# Patient Record
Sex: Male | Born: 1985 | Race: White | Hispanic: No | Marital: Married | Smoking: Never smoker
Health system: Southern US, Community
[De-identification: ages and names within clinical notes are randomized; demographics above are authoritative.]

## PROBLEM LIST (undated history)

## (undated) DIAGNOSIS — F909 Attention-deficit hyperactivity disorder, unspecified type: Secondary | ICD-10-CM

## (undated) HISTORY — PX: FRACTURE SURGERY: SHX138

## (undated) HISTORY — DX: Attention-deficit hyperactivity disorder, unspecified type: F90.9

---
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
## 2007-06-27 HISTORY — PX: OTHER SURGICAL HISTORY: SHX169

## 2008-07-08 ENCOUNTER — Ambulatory Visit: Payer: Self-pay | Admitting: Family Medicine

## 2009-07-30 ENCOUNTER — Ambulatory Visit: Payer: Self-pay | Admitting: Internal Medicine

## 2009-08-03 ENCOUNTER — Ambulatory Visit: Payer: Self-pay | Admitting: Internal Medicine

## 2009-08-03 DIAGNOSIS — N508 Other specified disorders of male genital organs: Secondary | ICD-10-CM | POA: Insufficient documentation

## 2009-08-23 ENCOUNTER — Ambulatory Visit: Payer: Self-pay | Admitting: Internal Medicine

## 2009-09-01 ENCOUNTER — Encounter: Admission: RE | Admit: 2009-09-01 | Discharge: 2009-09-01 | Payer: Self-pay | Admitting: Internal Medicine

## 2009-09-06 ENCOUNTER — Telehealth (INDEPENDENT_AMBULATORY_CARE_PROVIDER_SITE_OTHER): Payer: Self-pay | Admitting: *Deleted

## 2009-11-11 ENCOUNTER — Encounter: Payer: Self-pay | Admitting: Internal Medicine

## 2010-06-26 HISTORY — PX: EYE SURGERY: SHX253

## 2010-06-26 HISTORY — PX: REFRACTIVE SURGERY: SHX103

## 2010-07-17 ENCOUNTER — Encounter: Payer: Self-pay | Admitting: Internal Medicine

## 2010-07-26 NOTE — Assessment & Plan Note (Signed)
Summary: CONGESTION/COLD/KDC   Vital Signs:  Patient profile:   25 year old male Height:      70 inches Weight:      222.2 pounds BMI:     32.00 Temp:     98.4 degrees F BP sitting:   120 / 80  Vitals Entered By: Shary Decamp (July 30, 2009 3:10 PM) CC: acute only Is Patient Diabetic? No Comments  - congestion x 1 week  - scatchy throat, hoarse  - cough x yesterday Shary Decamp  July 30, 2009 3:12 PM    History of Present Illness: as above   Current Medications (verified): 1)  None  Allergies (verified): No Known Drug Allergies  Past History:  Past Medical History: Reviewed history from 07/08/2008 and no changes required. none   Past Surgical History: Reviewed history from 07/08/2008 and no changes required. left arm-plates right wrist-cyst   Social History: Reviewed history from 07/08/2008 and no changes required. no tobacco  Review of Systems       denies fevers no nausea or vomiting he has a lot of mucus in the throat every morning and he needs to cough it  up. Has not been taking any medication for the symptoms does  not feel that he has a lot of sinus or chest congestion  Physical Exam  General:  alert and well-developed.   Head:  face is symmetric, nontender Ears:  R ear normal and L ear normal.   Nose:  mild congestion Mouth:  no redness or discharge Lungs:  normal respiratory effort, no intercostal retractions, no accessory muscle use, and normal breath sounds.   Heart:  normal rate, regular rhythm, and no murmur.     Impression & Recommendations:  Problem # 1:  URI (ICD-465.9)  Patient Instructions: 1)   Get plenty of rest, drink lots of clear liquids, and use Tylenol or Ibuprofen as needed 2)  Sudafed 30 mg ( behind the counter ) take one tablet every 4 hours as needed for congestion 3)  mucinex DM twice in the as needed for cough and congestion  4)  call if not better by next week

## 2010-07-26 NOTE — Progress Notes (Signed)
Summary: referral  Phone Note Call from Patient Call back at [phone]   Caller: Mom Summary of Call: pt mother left VM stating that pt is still experiencing pain and would like to be referred.............Marland KitchenFelecia Deloach CMA  September 06, 2009 1:05 PM   Follow-up for Phone Call        pt mother aware referral put in awaiting appt info...........Marland KitchenFelecia Deloach CMA  September 06, 2009 2:30 PM

## 2010-07-26 NOTE — Assessment & Plan Note (Signed)
Summary: LUMP BEHIND TESTICLE/RH.....   Vital Signs:  Patient profile:   25 year old male Height:      70 inches Weight:      223.6 pounds Temp:     98.3 degrees F Pulse rate:   80 / minute Resp:     13 per minute BP sitting:   112 / 64  Vitals Entered By: Dena Billet  History of Present Illness: He felt "pulling " sensation in scrotum 2 weeks; it is constant now as , affected by position. Associated with nausea. He noted a "lump" behind L testicle.He had similar symptom 6-7 mos ago for 5-6 days. No trigger or injury.Rx: none  Allergies: No Known Drug Allergies  Past History:  Past Surgical History: left arm-plates right wrist-? cyst   Review of Systems General:  Denies chills, fever, sweats, and weight loss. GU:  Denies discharge, dysuria, genital sores, and hematuria.  Physical Exam  General:  well-nourished,in no acute distress; alert,appropriate and cooperative throughout examination Genitalia:  Testes bilaterally descended without nodularity, tenderness or masses.  1X0.5 cm mass  adjacent to L testice . No penis lesions or urethral discharge. Skin:  Intact without suspicious lesions or rashes Inguinal Nodes:  No significant adenopathy   Impression & Recommendations:  Problem # 1:  SCROTAL MASS (ICD-608.89) Probable painless  epididymitis   Complete Medication List: 1)  Doxycycline Hyclate 100 Mg Caps (Doxycycline hyclate) .Marland Kitchen.. 1 two times a day  Patient Instructions: 1)  Sitz  baths two times a day . Ultrasound of scrotum if the peri testicular lesion fails to resolve with antibiotics . Prescriptions: DOXYCYCLINE HYCLATE 100 MG CAPS (DOXYCYCLINE HYCLATE) 1 two times a day  #20 x 0   Entered and Authorized by:   Marga Melnick MD   Signed by:   Marga Melnick MD on 08/03/2009   Method used:   Print then Give to Patient   RxID:   [id number]

## 2010-07-26 NOTE — Assessment & Plan Note (Signed)
Summary: place is still there behind testicle/kdc   Vital Signs:  Patient profile:   25 year old male Weight:      226 pounds Pulse rate:   84 / minute Resp:     14 per minute BP sitting:   110 / 76  (left arm)  Vitals Entered By: Doristine Devoid (August 23, 2009 1:37 PM) CC: lump on testicle somewhat tender to touch   CC:  lump on testicle somewhat tender to touch.  History of Present Illness: Random throbbing  pain in scrotum lasting up to 20 minutes; last episode was 08/21/2009 while in bed.He has noted decreased "pulling " sensation after 10 days of Doxycycline. No FH of GU disease.  Allergies: No Known Drug Allergies  Review of Systems General:  Denies chills, fever, sweats, and weight loss. GU:  Denies discharge, dysuria, hematuria, nocturia, urinary frequency, and urinary hesitancy.  Physical Exam  General:  well-nourished,in no acute distress; alert,appropriate and cooperative throughout examination Genitalia:  Testes bilaterally descended without nodularity, tenderness or masses. No scrotal masses or lesions but prominent epididymis on L. No penis lesions or urethral discharge. L varicocele.   Skin:  Intact without suspicious lesions or rashes Inguinal Nodes:  No significant adenopathy Psych:  memory intact for recent and remote, normally interactive, and good eye contact.     Impression & Recommendations:  Problem # 1:  SCROTAL MASS (ICD-608.89)  ? low grade Epididymis on L  Orders: Radiology Referral (Radiology)  Complete Medication List: 1)  Ciprofloxacin Hcl 500 Mg Tabs (Ciprofloxacin hcl) .Marland Kitchen.. 1 two times a day  Patient Instructions: 1)  Complete US of scrotum. Prescriptions: CIPROFLOXACIN HCL 500 MG TABS (CIPROFLOXACIN HCL) 1 two times a day  #14 x 0   Entered and Authorized by:   Marga Melnick MD   Signed by:   Marga Melnick MD on 08/23/2009   Method used:   Print then Give to Patient   RxID:   [id number]

## 2010-07-26 NOTE — Consult Note (Signed)
Summary: [facility]  [facility]   Imported By: Lanelle Bal 11/29/2009 13:53:57  _____________________________________________________________________  External Attachment:    Type:   Image     Comment:   External Document

## 2011-08-23 ENCOUNTER — Encounter: Payer: Self-pay | Admitting: Family Medicine

## 2011-08-23 ENCOUNTER — Ambulatory Visit (INDEPENDENT_AMBULATORY_CARE_PROVIDER_SITE_OTHER): Payer: BC Managed Care – PPO | Admitting: Family Medicine

## 2011-08-23 VITALS — BP 125/80 | HR 97 | Temp 98.6°F | Ht 69.5 in | Wt 216.0 lb

## 2011-08-23 DIAGNOSIS — N451 Epididymitis: Secondary | ICD-10-CM

## 2011-08-23 DIAGNOSIS — N453 Epididymo-orchitis: Secondary | ICD-10-CM

## 2011-08-23 LAB — POCT URINALYSIS DIPSTICK
Blood, UA: NEGATIVE
Glucose, UA: NEGATIVE
Ketones, UA: NEGATIVE
Leukocytes, UA: NEGATIVE
Spec Grav, UA: 1.01
pH, UA: 8

## 2011-08-23 MED ORDER — DOXYCYCLINE HYCLATE 100 MG PO TABS: 100.0000 mg | ORAL_TABLET | Freq: Two times a day (BID) | ORAL | Status: DC

## 2011-08-23 MED ORDER — NAPROXEN 500 MG PO TABS: 500.0000 mg | ORAL_TABLET | Freq: Two times a day (BID) | ORAL | Status: DC

## 2011-08-23 MED ORDER — DOXYCYCLINE HYCLATE 100 MG PO TABS: 100.0000 mg | ORAL_TABLET | Freq: Two times a day (BID) | ORAL | Status: AC

## 2011-08-23 NOTE — Progress Notes (Signed)
  Subjective:    Patient ID: Phillip Harris, male    DOB: [birth year], 26 y.o.   MRN: [id number]  HPI Scrotal pain- had similar sxs 1-2 yrs ago.  Had L scrotal US w/ uro and was started on abx and NSAID.  sxs started 10 days ago.  sxs described as a 'swelling and tugging'.  No fevers.  No concerns about STDs.  No penile dc.  No dysuria.     Review of Systems For ROS see HPI     Objective:   Physical Exam  Vitals reviewed. Constitutional: He appears well-developed and well-nourished. No distress.  Genitourinary: Penis normal. Right testis shows no mass, no swelling and no tenderness. Left testis shows swelling and tenderness (over epididymis posteriorly). Left testis shows no mass. Left testis is descended. Circumcised. No discharge found.          Assessment & Plan:

## 2011-08-23 NOTE — Patient Instructions (Signed)
This is most likely the same thing you had before (epididymitis) Start the Doxy twice daily (w/ food) Use the Naproxen twice daily for pain and inflammation If symptoms change or worsen- please call! Hang in there!!

## 2011-08-24 LAB — GC/CHLAMYDIA PROBE AMP, URINE: GC Probe Amp, Urine: NEGATIVE

## 2011-08-27 NOTE — Assessment & Plan Note (Signed)
New.  Pt w/ hx of similar.  Per protocol, will get urine cx and GC/CT test.  Start doxy.  NSAIDs prn for pain.  Reviewed supportive care and red flags that should prompt return.  Pt expressed understanding and is in agreement w/ plan.

## 2011-08-30 ENCOUNTER — Telehealth: Payer: Self-pay

## 2011-08-30 DIAGNOSIS — N451 Epididymitis: Secondary | ICD-10-CM

## 2011-08-30 NOTE — Telephone Encounter (Signed)
Left message on voicemail with Dr.Tabori's recommendation's, patient to call and speak with Triage if questions or concerns

## 2011-08-30 NOTE — Telephone Encounter (Signed)
[facility] Triage Call Report Triage Record Num: [id number] Operator: Jeraldine Loots Patient Name: Phillip Harris Call Date & Time: 08/29/2011 4:55:37PM Patient Phone: [phone] PCP: Patient Gender: Male PCP Fax : Patient DOB: 1985-11-11 Practice Name: [facility] Reason for Call: Caller: Chris/Patient is calling with a question about the medication he was placed on for epididymitis. Has been on the medicaiton x 2 weeks and has not seen any improvement. He does not know the names of either medication. Denies any fever. Only has pain/swelling in his scrotum. Uncomfortable when he sits down. He is in [place], please call meds to [facility] at [phone]. Protocol(s) Used: Scrotum or Testicles Symptoms Recommended Outcome per Protocol: See Provider within 72 Hours Reason for Outcome: Lump or thickened, hardened mass Care Advice: ~ Dr.Tabori Please advise

## 2011-08-30 NOTE — Telephone Encounter (Signed)
If he is not improving on Cipro or having worsening sxs he will need urology appt.  Not sure what to do about him being in [place]- he may need UC there

## 2011-08-30 NOTE — Telephone Encounter (Signed)
.  left message to have patient return my call.  

## 2011-08-31 NOTE — Telephone Encounter (Signed)
Called pt to clarify how he is doing per noted left vm to call office, spoke to pt and he advised that his sxs are not feeling better, advised that we can set him up with a urologist after 1pm per he is out of school, sent referral in chart and left note for pt referral coordinator per pt preference, advised that pt should go to UC if his sxs worsen, pt advised that there are no UC in [place],advised that he can visit an ER in that area, pt understood and is waiting for call about his urology apt

## 2011-08-31 NOTE — Telephone Encounter (Signed)
Addended by: Derry Lory A on: 08/31/2011 03:56 PM   Modules accepted: Orders

## 2011-10-31 ENCOUNTER — Ambulatory Visit (INDEPENDENT_AMBULATORY_CARE_PROVIDER_SITE_OTHER): Payer: BC Managed Care – PPO | Admitting: Urology

## 2011-10-31 DIAGNOSIS — R1032 Left lower quadrant pain: Secondary | ICD-10-CM

## 2011-11-21 ENCOUNTER — Encounter (INDEPENDENT_AMBULATORY_CARE_PROVIDER_SITE_OTHER): Payer: Self-pay | Admitting: Surgery

## 2011-11-21 ENCOUNTER — Ambulatory Visit (INDEPENDENT_AMBULATORY_CARE_PROVIDER_SITE_OTHER): Payer: BC Managed Care – PPO | Admitting: Surgery

## 2011-11-21 VITALS — BP 108/80 | HR 76 | Temp 97.4°F | Resp 14 | Ht 69.0 in | Wt 206.8 lb

## 2011-11-21 DIAGNOSIS — R109 Unspecified abdominal pain: Secondary | ICD-10-CM

## 2011-11-21 DIAGNOSIS — R1030 Lower abdominal pain, unspecified: Secondary | ICD-10-CM

## 2011-11-21 NOTE — Patient Instructions (Signed)
Hernia Repair with Laparoscope A hernia occurs when an internal organ pushes out through a weak spot in the belly (abdominal) wall muscles. Hernias most commonly occur in the groin and around the navel. Hernias can also occur through a cut by the surgeon (incision) after an abdominal operation. A hernia may be caused by:  Lifting heavy objects.   Prolonged coughing.   Straining to move your bowels.  Hernias can often be pushed back into place (reduced). Most hernias tend to get worse over time. Problems occur when abdominal contents get stuck in the opening and the blood supply is blocked or impaired (incarcerated hernia). Because of these risks, you require surgery to repair the hernia. Your hernia will be repaired using a laparoscope. Laparoscopic surgery is a type of minimally invasive surgery. It does not involve making a typical surgical cut (incision) in the skin. A laparoscope is a telescope-like rod and lens system. It is usually connected to a video camera and a light source so your caregiver can clearly see the operative area. The instruments are inserted through  to  inch (5 mm or 10 mm) openings in the skin at specific locations. A working and viewing space is created by blowing a small amount of carbon dioxide gas into the abdominal cavity. The abdomen is essentially blown up like a balloon (insufflated). This elevates the abdominal wall above the internal organs like a dome. The carbon dioxide gas is common to the human body and can be absorbed by tissue and removed by the respiratory system. Once the repair is completed, the small incisions will be closed with either stitches (sutures) or staples (just like a paper stapler only this staple holds the skin together). LET YOUR CAREGIVERS KNOW ABOUT:  Allergies.   Medications taken including herbs, eye drops, over the counter medications, and creams.   Use of steroids (by mouth or creams).   Previous problems with anesthetics or  Novocaine.   Possibility of pregnancy, if this applies.   History of blood clots (thrombophlebitis).   History of bleeding or blood problems.   Previous surgery.   Other health problems.  BEFORE THE PROCEDURE  Laparoscopy can be done either in a hospital or out-patient clinic. You may be given a mild sedative to help you relax before the procedure. Once in the operating room, you will be given a general anesthesia to make you sleep (unless you and your caregiver choose a different anesthetic).  AFTER THE PROCEDURE  After the procedure you will be watched in a recovery area. Depending on what type of hernia was repaired, you might be admitted to the hospital or you might go home the same day. With this procedure you may have less pain and scarring. This usually results in a quicker recovery and less risk of infection. HOME CARE INSTRUCTIONS   Bed rest is not required. You may continue your normal activities but avoid heavy lifting (more than 10 pounds) or straining.   Cough gently. If you are a smoker it is best to stop, as even the best hernia repair can break down with the continual strain of coughing.   Avoid driving until given the OK by your surgeon.   There are no dietary restrictions unless given otherwise.   TAKE ALL MEDICATIONS AS DIRECTED.   Only take over-the-counter or prescription medicines for pain, discomfort, or fever as directed by your caregiver.  SEEK MEDICAL CARE IF:   There is increasing abdominal pain or pain in your incisions.     There is more bleeding from incisions, other than minimal spotting.   You feel light headed or faint.   You develop an unexplained fever, chills, and/or an oral temperature above 102 F (38.9 C).   You have redness, swelling, or increasing pain in the wound.   Pus coming from wound.   A foul smell coming from the wound or dressings.  SEEK IMMEDIATE MEDICAL CARE IF:   You develop a rash.   You have difficulty breathing.    You have any allergic problems.  MAKE SURE YOU:   Understand these instructions.   Will watch your condition.   Will get help right away if you are not doing well or get worse.  Document Released: 06/12/2005 Document Revised: 06/01/2011 Document Reviewed: 05/12/2009 Exit[employer] Patient Information 2012 [employer]. 

## 2011-11-21 NOTE — Progress Notes (Signed)
Patient ID: Phillip Harris, male   DOB: Apr 17, 1986, 26 y.o.   MRN: [id number]  Chief Complaint  Patient presents with  . New Evaluation    New Pt. Abd Pain/Hernia    HPI Phillip Harris is a 26 y.o. male.   HPIPatient sent at the request of Dr. Juan Quam due to left groin pain. He has a history of left testicular discomfort and tugging sensation since 2011. He has been treated for epididymitis. This helped initially left testicular discomfort and tugging sensation had returned. He has had a full urologic workup and this has been within normal limits.  History reviewed. No pertinent past medical history.  Past Surgical History  Procedure Date  . Refractive surgery 2012  . Left forearm surgery 2004  . Right wrist surgery 2009    Family History  Problem Relation Age of Onset  . Diabetes Mother     Social History History  Substance Use Topics  . Smoking status: Never Smoker   . Smokeless tobacco: Not on file  . Alcohol Use: Yes     occasionally    No Known Allergies  No current outpatient prescriptions on file.    Review of Systems Review of Systems  Constitutional: Negative for fever, chills and unexpected weight change.  HENT: Negative for hearing loss, congestion, sore throat, trouble swallowing and voice change.   Eyes: Negative for visual disturbance.  Respiratory: Negative for cough and wheezing.   Cardiovascular: Negative for chest pain, palpitations and leg swelling.  Gastrointestinal: Negative for nausea, vomiting, abdominal pain, diarrhea, constipation, blood in stool, abdominal distention, anal bleeding and rectal pain.  Genitourinary: Negative for hematuria and difficulty urinating.  Musculoskeletal: Negative for arthralgias.  Skin: Negative for rash and wound.  Neurological: Negative for seizures, syncope, weakness and headaches.  Hematological: Negative for adenopathy. Does not bruise/bleed easily.  Psychiatric/Behavioral: Negative for  confusion.    Blood pressure 108/80, pulse 76, temperature 97.4 F (36.3 C), temperature source Temporal, resp. rate 14, height 5\' 9"  (1.753 m), weight 206 lb 12.8 oz (93.804 kg).  Physical Exam Physical Exam  Constitutional: He appears well-developed and well-nourished.  HENT:  Head: Normocephalic and atraumatic.  Eyes: EOM are normal. Pupils are equal, round, and reactive to light.  Neck: Normal range of motion.  Cardiovascular: Normal rate and regular rhythm.   Pulmonary/Chest: Effort normal and breath sounds normal.  Abdominal: Soft. Bowel sounds are normal. He exhibits no distension.  Genitourinary:       Data Reviewed Notes from Dr Juan Quam  Assessment    Left groin pain    Plan    Discussed options with the patient. He is no obvious hernia on exam. Both testicles and spermatic cord feel normal. I discussed laparoscopic exploration and possible left inguinal hernia repair with mesh. I discussed observation as well. There is no testing will be helpful in this situation. Risks, benefits and alternative therapies all discussed. Like to proceed with laparoscopic left inguinal hernia repair and exploration.  The risk of hernia repair include bleeding,  Infection,   Recurrence of the hernia,  Mesh use, chronic pain,  Organ injury,  Bowel injury,  Bladder injury,   nerve injury with numbness around the incision,  Death,  and worsening of preexisting  medical problems.  The alternatives to surgery have been discussed as well..  Long term expectations of both operative and non operative treatments have been discussed.   The patient agrees to proceed.       Callyn Severtson A. 11/21/2011,  2:59 PM

## 2011-12-27 ENCOUNTER — Ambulatory Visit: Admit: 2011-12-27 | Payer: Self-pay | Admitting: Surgery

## 2011-12-27 SURGERY — REPAIR, HERNIA, INGUINAL, LAPAROSCOPIC
Anesthesia: General | Laterality: Left

## 2012-01-23 ENCOUNTER — Encounter (INDEPENDENT_AMBULATORY_CARE_PROVIDER_SITE_OTHER): Payer: BC Managed Care – PPO | Admitting: Surgery

## 2013-05-11 ENCOUNTER — Encounter (HOSPITAL_BASED_OUTPATIENT_CLINIC_OR_DEPARTMENT_OTHER): Payer: Self-pay | Admitting: Emergency Medicine

## 2013-05-11 ENCOUNTER — Emergency Department (HOSPITAL_BASED_OUTPATIENT_CLINIC_OR_DEPARTMENT_OTHER)
Admission: EM | Admit: 2013-05-11 | Discharge: 2013-05-11 | Disposition: A | Discharge status: Home / Self Care | Payer: Worker's Compensation | Attending: Emergency Medicine | Admitting: Emergency Medicine

## 2013-05-11 ENCOUNTER — Emergency Department (HOSPITAL_BASED_OUTPATIENT_CLINIC_OR_DEPARTMENT_OTHER): Payer: Worker's Compensation

## 2013-05-11 DIAGNOSIS — Y9389 Activity, other specified: Secondary | ICD-10-CM | POA: Insufficient documentation

## 2013-05-11 DIAGNOSIS — S9781XA Crushing injury of right foot, initial encounter: Secondary | ICD-10-CM

## 2013-05-11 DIAGNOSIS — S9780XA Crushing injury of unspecified foot, initial encounter: Secondary | ICD-10-CM | POA: Insufficient documentation

## 2013-05-11 DIAGNOSIS — Y92410 Unspecified street and highway as the place of occurrence of the external cause: Secondary | ICD-10-CM | POA: Insufficient documentation

## 2013-05-11 DIAGNOSIS — Y990 Civilian activity done for income or pay: Secondary | ICD-10-CM | POA: Insufficient documentation

## 2013-05-11 DIAGNOSIS — IMO0002 Reserved for concepts with insufficient information to code with codable children: Secondary | ICD-10-CM | POA: Insufficient documentation

## 2013-05-11 NOTE — ED Notes (Signed)
Transported to xray 
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
## 2013-05-11 NOTE — ED Notes (Signed)
Returned from xray
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
## 2014-04-27 ENCOUNTER — Encounter: Payer: Self-pay | Admitting: Internal Medicine

## 2014-04-27 ENCOUNTER — Ambulatory Visit (INDEPENDENT_AMBULATORY_CARE_PROVIDER_SITE_OTHER): Payer: 59 | Admitting: Internal Medicine

## 2014-04-27 VITALS — BP 145/71 | HR 69 | Temp 98.3°F | Wt 216.2 lb

## 2014-04-27 DIAGNOSIS — K625 Hemorrhage of anus and rectum: Secondary | ICD-10-CM

## 2014-04-27 DIAGNOSIS — K648 Other hemorrhoids: Secondary | ICD-10-CM

## 2014-04-27 HISTORY — DX: Other hemorrhoids: K64.8

## 2014-04-27 LAB — CBC WITH DIFFERENTIAL/PLATELET
BASOS PCT: 0.3 % (ref 0.0–3.0)
Basophils Absolute: 0 10*3/uL (ref 0.0–0.1)
EOS ABS: 0.3 10*3/uL (ref 0.0–0.7)
EOS PCT: 4.8 % (ref 0.0–5.0)
HCT: 42.2 % (ref 39.0–52.0)
Hemoglobin: 13.8 g/dL (ref 13.0–17.0)
LYMPHS ABS: 1.9 10*3/uL (ref 0.7–4.0)
LYMPHS PCT: 31.4 % (ref 12.0–46.0)
MCHC: 32.5 g/dL (ref 30.0–36.0)
MCV: 86.8 fl (ref 78.0–100.0)
MONO ABS: 0.5 10*3/uL (ref 0.1–1.0)
MONOS PCT: 7.7 % (ref 3.0–12.0)
Neutro Abs: 3.4 10*3/uL (ref 1.4–7.7)
Neutrophils Relative %: 55.8 % (ref 43.0–77.0)
Platelets: 187 10*3/uL (ref 150.0–400.0)
RBC: 4.87 Mil/uL (ref 4.22–5.81)
RDW: 12.7 % (ref 11.5–15.5)
WBC: 6.1 10*3/uL (ref 4.0–10.5)

## 2014-04-27 MED ORDER — HYDROCORTISONE ACETATE 25 MG RE SUPP: 25.0000 mg | Freq: Two times a day (BID) | RECTAL | Status: DC | PRN

## 2014-04-27 NOTE — Patient Instructions (Signed)
Keep the area clean and dry Metamucil one or 2 capsules every day with   at the time of your breakfast Use the suppositories if you have any itching, bleeding or discomfort Call anytime if symptoms severe     Hemorrhoids Hemorrhoids are swollen veins around the rectum or anus. There are two types of hemorrhoids:   Internal hemorrhoids. These occur in the veins just inside the rectum. They may poke through to the outside and become irritated and painful.  External hemorrhoids. These occur in the veins outside the anus and can be felt as a painful swelling or hard lump near the anus. CAUSES  Pregnancy.   Obesity.   Constipation or diarrhea.   Straining to have a bowel movement.   Sitting for long periods on the toilet.  Heavy lifting or other activity that caused you to strain.  Anal intercourse. SYMPTOMS   Pain.   Anal itching or irritation.   Rectal bleeding.   Fecal leakage.   Anal swelling.   One or more lumps around the anus.  DIAGNOSIS  Your caregiver may be able to diagnose hemorrhoids by visual examination. Other examinations or tests that may be performed include:   Examination of the rectal area with a gloved hand (digital rectal exam).   Examination of anal canal using a small tube (scope).   A blood test if you have lost a significant amount of blood.  A test to look inside the colon (sigmoidoscopy or colonoscopy). TREATMENT Most hemorrhoids can be treated at home. However, if symptoms do not seem to be getting better or if you have a lot of rectal bleeding, your caregiver may perform a procedure to help make the hemorrhoids get smaller or remove them completely. Possible treatments include:   Placing a rubber band at the base of the hemorrhoid to cut off the circulation (rubber band ligation).   Injecting a chemical to shrink the hemorrhoid (sclerotherapy).   Using a tool to burn the hemorrhoid (infrared light therapy).    Surgically removing the hemorrhoid (hemorrhoidectomy).   Stapling the hemorrhoid to block blood flow to the tissue (hemorrhoid stapling).  HOME CARE INSTRUCTIONS   Eat foods with fiber, such as whole grains, beans, nuts, fruits, and vegetables. Ask your doctor about taking products with added fiber in them (fibersupplements).  Increase fluid intake. Drink enough water and fluids to keep your urine clear or pale yellow.   Exercise regularly.   Go to the bathroom when you have the urge to have a bowel movement. Do not wait.   Avoid straining to have bowel movements.   Keep the anal area dry and clean. Use wet toilet paper or moist towelettes after a bowel movement.   Medicated creams and suppositories may be used or applied as directed.   Only take over-the-counter or prescription medicines as directed by your caregiver.   Take warm sitz baths for 15-20 minutes, 3-4 times a day to ease pain and discomfort.   Place ice packs on the hemorrhoids if they are tender and swollen. Using ice packs between sitz baths may be helpful.   Put ice in a plastic bag.   Place a towel between your skin and the bag.   Leave the ice on for 15-20 minutes, 3-4 times a day.   Do not use a donut-shaped pillow or sit on the toilet for long periods. This increases blood pooling and pain.  SEEK MEDICAL CARE IF:  You have increasing pain and swelling that is not  controlled by treatment or medicine.  You have uncontrolled bleeding.  You have difficulty or you are unable to have a bowel movement.  You have pain or inflammation outside the area of the hemorrhoids. MAKE SURE YOU:  Understand these instructions.  Will watch your condition.  Will get help right away if you are not doing well or get worse. Document Released: 06/09/2000 Document Revised: 05/29/2012 Document Reviewed: 04/16/2012 [facility] Patient Information 2015 [employer] [place]. This information is not intended to  replace advice given to you by your health care provider. Make sure you discuss any questions you have with your health care provider.

## 2014-04-27 NOTE — Progress Notes (Signed)
Pre visit review using our clinic review tool, if applicable. No additional management support is needed unless otherwise documented below in the visit note. 

## 2014-04-27 NOTE — Progress Notes (Signed)
   Subjective:    Patient ID: Phillip Harris, male    DOB: January 23, 1986, 28 y.o.   MRN: [id number]  DOS:  04/27/2014 Type of visit - description : acute Interval history: one-month history of blood per rectum. For the last 4 weeks every time he had a bowel movement he saw red blood  On top  the stools, never mixed w/ it . Sometimes he saw red fresh blood after he had a BM (in the toilete paper) No symptoms for the last 2 days. No previous history of hemorrhoids. Very seldom has itching/discomfort at the rectal anal area  ROS Denies fever, chills. No weight loss. No nausea, vomiting, diarrhea or abdominal pain. Occasional constipation  No past medical history on file.  Past Surgical History  Procedure Laterality Date  . Refractive surgery  2012  . Left forearm surgery  2004  . Right wrist surgery  2009    History   Social History  . Marital Status: Single    Spouse Name: N/A    Number of Children: N/A  . Years of Education: N/A   Occupational History  . Not on file.   Social History Main Topics  . Smoking status: Never Smoker   . Smokeless tobacco: Not on file  . Alcohol Use: Yes     Comment: occasionally  . Drug Use: No  . Sexual Activity: Not on file   Other Topics Concern  . Not on file   Social History Narrative        Medication List    Notice  As of 04/27/2014 10:14 AM   You have not been prescribed any medications.         Objective:   Physical Exam BP 145/71 mmHg  Pulse 69  Temp(Src) 98.3 F (36.8 C) (Oral)  Wt 216 lb 4 oz (98.09 kg)  SpO2 98%  General -- alert, well-developed, NAD.    HEENT-- Not pale.   Lungs -- normal respiratory effort, no intercostal retractions, no accessory muscle use, and normal breath sounds.  Heart-- normal rate, regular rhythm, no murmur.  Abdomen-- Not distended, good bowel sounds,soft, non-tender.  Rectal-- No external abnormalities noted. Normal sphincter tone. No rectal masses or tenderness. Stool  brown Anoscopy: few small internal hemorrhoids Prostate--Prostate gland firm and smooth, no enlargement, nodularity, tenderness, mass, asymmetry or induration. Extremities-- no pretibial edema bilaterally   Psych-- Cognition and judgment appear intact. Cooperative with normal attention span and concentration. No anxious or depressed appearing.       Assessment & Plan:

## 2014-04-27 NOTE — Assessment & Plan Note (Signed)
Red blood per rectum, One-month history of red blood per rectum, he does have internal hemorrhoids. Plan: CBC, hemorrhoid care  Discussed refer to GI if anemia or sx persist

## 2014-06-17 ENCOUNTER — Encounter: Payer: Self-pay | Admitting: Physician Assistant

## 2014-06-17 ENCOUNTER — Telehealth: Payer: Self-pay | Admitting: Internal Medicine

## 2014-06-17 ENCOUNTER — Ambulatory Visit (INDEPENDENT_AMBULATORY_CARE_PROVIDER_SITE_OTHER): Payer: 59 | Admitting: Physician Assistant

## 2014-06-17 VITALS — BP 126/60 | HR 71 | Temp 98.6°F | Resp 16 | Ht 70.0 in | Wt 222.0 lb

## 2014-06-17 DIAGNOSIS — Z8659 Personal history of other mental and behavioral disorders: Secondary | ICD-10-CM

## 2014-06-17 DIAGNOSIS — F909 Attention-deficit hyperactivity disorder, unspecified type: Secondary | ICD-10-CM | POA: Insufficient documentation

## 2014-06-17 NOTE — Telephone Encounter (Signed)
Caller name: Farrell OursMartin, Jarmel E Relation to pt: self  Call back number: [phone]   Reason for call:  Pt states the psychiatrist your referred Dr. Westley ChandlerKarr the front desk clerk told him they don't prescribed Adderal. Pt in need of clinical advice

## 2014-06-17 NOTE — Progress Notes (Signed)
Patient presents to clinic today to discuss restarting ADHD medications.  Patient endorses being diagnosed at age 28.  Endorses being placed on Adderall and Ritalin from Elementary school through about the 7th grade.  Did not require medication throughout high school.  Patient states he weaned himself off of the medication.  State he was able to make it through high school without medication.  Currently works as a Emergency planning/management officerpolice officer and endorses good focus on the job.  Is starting school again to finish his bachelor's and is concerned that the workload combined with working full-time will cause his symptoms to resurface.  No past medical history on file.  No current outpatient prescriptions on file prior to visit.   No current facility-administered medications on file prior to visit.    No Known Allergies  Family History  Problem Relation Age of Onset  . Diabetes Mother     History   Social History  . Marital Status: Single    Spouse Name: N/A    Number of Children: 0  . Years of Education: N/A   Social History Main Topics  . Smoking status: Never Smoker   . Smokeless tobacco: Never Used  . Alcohol Use: 0.0 oz/week    0 Not specified per week     Comment: occasionally  . Drug Use: No  . Sexual Activity: None   Other Topics Concern  . None   Social History Narrative    Review of Systems - See HPI.  All other ROS are negative.  BP 126/60 mmHg  Pulse 71  Temp(Src) 98.6 F (37 C) (Oral)  Resp 16  Ht 5\' 10"  (1.778 m)  Wt 222 lb (100.699 kg)  BMI 31.85 kg/m2  SpO2 99%  Physical Exam  Constitutional: He is oriented to person, place, and time and well-developed, well-nourished, and in no distress.  HENT:  Head: Normocephalic and atraumatic.  Neurological: He is alert and oriented to person, place, and time.  Skin: Skin is warm and dry. No rash noted.  Psychiatric: Affect normal.    Recent Results (from the past 2160 hour(s))  CBC with Differential     Status: None     Collection Time: 04/27/14 10:37 AM  Result Value Ref Range   WBC 6.1 4.0 - 10.5 K/uL   RBC 4.87 4.22 - 5.81 Mil/uL   Hemoglobin 13.8 13.0 - 17.0 g/dL   HCT 16.142.2 09.639.0 - 04.552.0 %   MCV 86.8 78.0 - 100.0 fl   MCHC 32.5 30.0 - 36.0 g/dL   RDW 40.912.7 81.111.5 - 91.415.5 %   Platelets 187.0 150.0 - 400.0 K/uL   Neutrophils Relative % 55.8 43.0 - 77.0 %   Lymphocytes Relative 31.4 12.0 - 46.0 %   Monocytes Relative 7.7 3.0 - 12.0 %   Eosinophils Relative 4.8 0.0 - 5.0 %   Basophils Relative 0.3 0.0 - 3.0 %   Neutro Abs 3.4 1.4 - 7.7 K/uL   Lymphs Abs 1.9 0.7 - 4.0 K/uL   Monocytes Absolute 0.5 0.1 - 1.0 K/uL   Eosinophils Absolute 0.3 0.0 - 0.7 K/uL   Basophils Absolute 0.0 0.0 - 0.1 K/uL    Assessment/Plan: Hx of attention deficit hyperactivity disorder Not requiring medication for most of adolesence and all of adulthood thus far.  Patient will need formal evaluation by Psychology to assess for ADD and need for medication.  I am not patient's PCP, so I will not be starting medication.  This will be deferred to Dr. Drue NovelPaz.  Referral placed to Psychology for assessment. Handout given.  PAtient will schedule own appointment.

## 2014-06-17 NOTE — Progress Notes (Signed)
Pre visit review using our clinic review tool, if applicable. No additional management support is needed unless otherwise documented below in the visit note/SLS  

## 2014-06-17 NOTE — Telephone Encounter (Signed)
Spoke with patient about this matter to inform him that Referral is only for ADHD testing, medication will be prescribed by his PCP, Dr. Drue NovelPaz, afterwards; pt understands & states that he was informed that office he was instructed to call informed him that they did not do testing either. Advised patient that I would speak with Dr. Drue NovelPaz, as Selena BattenCody is out of the office until next week & also our [facility], who handles the referrals and contact him when I had furether information on this matter/SLS Spoke with Victorino DikeJennifer, [facility], and was informed that the ADHD testing is done through Psychiatry [not Psychology, as referred] and that a new referral was needed and that [facility]; new referral placed. Called patient and explained the situation and that he should be contacted about appointment soon; pt understood/SLS

## 2014-06-17 NOTE — Assessment & Plan Note (Signed)
Not requiring medication for most of adolesence and all of adulthood thus far.  Patient will need formal evaluation by Psychology to assess for ADD and need for medication.  I am not patient's PCP, so I will not be starting medication.  This will be deferred to Dr. Drue NovelPaz.  Referral placed to Psychology for assessment. Handout given.  PAtient will schedule own appointment.

## 2014-06-25 NOTE — Telephone Encounter (Signed)
Please give the Pt or/and his mother the number to contact either Aurther Lofterry or Raynelle FanningJulie in our office per Dr. Drue NovelPaz. Aurther Lofterry or Raynelle FanningJulie will do the ADHD testing to confirm diagnosis only, and Dr. Drue NovelPaz would prescribe medication if needed.

## 2014-06-25 NOTE — Telephone Encounter (Signed)
Please advise 

## 2014-06-25 NOTE — Telephone Encounter (Signed)
Patient mom called in stating that she [facility] and that person told her that they do no have any psychiatrist to do testing. Park MeoCeleste states that she would like patient to be referred elsewhere. [facility] # [phone] or [phone]

## 2014-06-25 NOTE — Telephone Encounter (Signed)
Please arrange a visit with one of our counselors (Julie-Terry or somebody else at [facility])  to confirm the diagnosis

## 2014-06-25 NOTE — Telephone Encounter (Signed)
Informed patient of this.  °

## 2014-07-15 ENCOUNTER — Ambulatory Visit (INDEPENDENT_AMBULATORY_CARE_PROVIDER_SITE_OTHER): Payer: 59 | Admitting: Psychology

## 2014-07-15 DIAGNOSIS — F902 Attention-deficit hyperactivity disorder, combined type: Secondary | ICD-10-CM

## 2014-07-31 ENCOUNTER — Ambulatory Visit (INDEPENDENT_AMBULATORY_CARE_PROVIDER_SITE_OTHER): Payer: 59 | Admitting: Psychology

## 2014-07-31 DIAGNOSIS — F902 Attention-deficit hyperactivity disorder, combined type: Secondary | ICD-10-CM

## 2014-09-14 MED ORDER — LISDEXAMFETAMINE DIMESYLATE 30 MG PO CAPS: 30.0000 mg | ORAL_CAPSULE | Freq: Every day | ORAL | Status: DC

## 2014-09-14 NOTE — Addendum Note (Signed)
Addended by: Dorette GrateFAULKNER, Daniele Yankowski C on: 09/14/2014 02:24 PM   Modules accepted: Orders, Medications

## 2014-09-14 NOTE — Telephone Encounter (Signed)
Rx placed at front desk. Contract printed.

## 2014-09-14 NOTE — Telephone Encounter (Signed)
Patient wanting to know if we received anything from behavorial health? Wanting to know if it's ok to prescribe adderall? [facility] # [phone]

## 2014-09-14 NOTE — Telephone Encounter (Signed)
[facility]?

## 2014-09-14 NOTE — Telephone Encounter (Signed)
Spoke with Pt, informed him of recommendations. Pt verbalized understanding. Informed Pt that Rx is printed, awaiting signature by Dr. Drue NovelPaz.

## 2014-09-14 NOTE — Telephone Encounter (Addendum)
Patient was recently seen by one of our extenders Mr. Daphine DeutscherMartin, subsequently he was evaluated by Bryson DamesSteven Altabet  ( psychologist)  , report reviewed and will be scanned:  diagnosed with ADHD. Advise patient: We could do a trial with Vyvanse 30 mg one tablet daily #30, no RF; read the pharmacy information regarding side effects carefully, if he likes to discuss this with medication with me before he starts, please make an appointment otherwise follow-up in one month

## 2014-10-09 ENCOUNTER — Ambulatory Visit (INDEPENDENT_AMBULATORY_CARE_PROVIDER_SITE_OTHER): Payer: 59 | Admitting: Internal Medicine

## 2014-10-09 ENCOUNTER — Encounter: Payer: Self-pay | Admitting: Internal Medicine

## 2014-10-09 VITALS — BP 108/72 | HR 68 | Temp 98.0°F | Ht 70.0 in | Wt 214.2 lb

## 2014-10-09 DIAGNOSIS — F909 Attention-deficit hyperactivity disorder, unspecified type: Secondary | ICD-10-CM | POA: Diagnosis not present

## 2014-10-09 MED ORDER — AMPHETAMINE-DEXTROAMPHETAMINE 10 MG PO TABS: 30.0000 mg | ORAL_TABLET | Freq: Every day | ORAL | Status: DC

## 2014-10-09 NOTE — Progress Notes (Signed)
Pre visit review using our clinic review tool, if applicable. No additional management support is needed unless otherwise documented below in the visit note. 

## 2014-10-09 NOTE — Patient Instructions (Signed)
Stop Vyvanse Start Adderall 10 mg: You can take 1 or 2 tablets in the morning You can take 1 or 2 tablets in the morning and an additional one tablet in the afternoon Drink plenty of fluids Come back in 3 months

## 2014-10-09 NOTE — Progress Notes (Signed)
   Subjective:    Patient ID: Phillip Harris, male    DOB: [birth year], 29 y.o.   MRN: [id number]  DOS:  10/09/2014 Type of visit - description : rov Interval history: Since the last visit, his taking Vyvanse, he noted somes help however he is having side effects: Dry mouth, mild headache, constipation. Would like to change medications.   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 No anxiety or depression  Past Medical History  Diagnosis Date  . ADHD (attention deficit hyperactivity disorder)     Dr. Reggy EyeAltabet    Past Surgical History  Procedure Laterality Date  . Refractive surgery  2012  . Left forearm surgery  2004  . Right wrist surgery  2009    History   Social History  . Marital Status: Single    Spouse Name: N/A  . Number of Children: 0  . Years of Education: N/A   Occupational History  . Not on file.   Social History Main Topics  . Smoking status: Never Smoker   . Smokeless tobacco: Never Used  . Alcohol Use: 0.0 oz/week    0 Standard drinks or equivalent per week     Comment: occasionally  . Drug Use: No  . Sexual Activity: Not on file   Other Topics Concern  . Not on file   Social History Narrative        Medication List       This list is accurate as of: 10/09/14 11:59 PM.  Always use your most recent med list.               amphetamine-dextroamphetamine 10 MG tablet  Commonly known as:  ADDERALL  Take 3 tablets (30 mg total) by mouth daily with breakfast.           Objective:   Physical Exam BP 108/72 mmHg  Pulse 68  Temp(Src) 98 F (36.7 C) (Oral)  Ht 5\' 10"  (1.778 m)  Wt 214 lb 4 oz (97.183 kg)  BMI 30.74 kg/m2  SpO2 98% General:   Well developed, well nourished . NAD.  HEENT:  Normocephalic . Face symmetric, atraumatic  Neurologic:  alert & oriented X3.  Speech normal, gait appropriate for age and unassisted Psych--    Cognition and judgment appear intact.  Cooperative with normal attention span and concentration.  Behavior appropriate. No anxious or depressed appearing.       Assessment & Plan:

## 2014-10-09 NOTE — Assessment & Plan Note (Signed)
On Vyvanse, it helps to some extent but is having side effects. Change medication? Many years ago he tried ritalin, at the time he had some problems with decreased appetite. 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 We elected Adderall. See instructions Call for refills if that works. Office visit in 3 months If he decide to switch to Strattera, will need   LFTs

## 2014-10-22 ENCOUNTER — Other Ambulatory Visit: Payer: Self-pay

## 2014-11-17 ENCOUNTER — Telehealth: Payer: Self-pay | Admitting: Internal Medicine

## 2014-11-17 MED ORDER — AMPHETAMINE-DEXTROAMPHETAMINE 10 MG PO TABS: 20.0000 mg | ORAL_TABLET | Freq: Every day | ORAL | Status: DC

## 2014-11-17 NOTE — Telephone Encounter (Signed)
Pt is requesting refill on Adderall. Also wanted to inform MD that he only takes 2 tablets instead of the prescribed 3 tablets.   Last OV: 10/09/2014 Last Fill: 10/09/2014 # 60 0RF  NEEDS UDS AT TIME OF PICK UP  Please advise.

## 2014-11-17 NOTE — Telephone Encounter (Signed)
Okay #60, no refills. UDS needed

## 2014-11-17 NOTE — Telephone Encounter (Signed)
Caller name: Ovidio KinChristopher Fildes Relationship to patient: self Can be reached: [phone]   Reason for call: Pt called for refill on amphetamine-dextroamphetamine (ADDERALL) 10 MG tablet. Pt has 5 doses left. Changed from 3/day to 2/day. Call pt about usage if needed. Please notify  Him when ready to pick up.

## 2014-11-17 NOTE — Telephone Encounter (Signed)
Rx changed from 3 tablets daily to 2 tablets daily per Pt. Rx printed, awaiting MD signature.

## 2014-11-18 NOTE — Telephone Encounter (Signed)
Please inform Pt that his Rx is ready for pick up at front desk. Thanks.

## 2014-11-18 NOTE — Telephone Encounter (Signed)
Notified pt that RX is ready. He will pick up tomorrow.

## 2014-11-20 ENCOUNTER — Telehealth: Payer: Self-pay | Admitting: Lab

## 2014-11-20 NOTE — Telephone Encounter (Signed)
Noted  

## 2014-12-04 ENCOUNTER — Telehealth: Payer: Self-pay

## 2014-12-04 NOTE — Telephone Encounter (Signed)
UDS: 11/26/2014  Positive for Adderall   Low risk per Dr. Drue Novel 12/03/2014

## 2014-12-08 ENCOUNTER — Telehealth: Payer: Self-pay | Admitting: Internal Medicine

## 2014-12-08 NOTE — Telephone Encounter (Signed)
I left a message for the patient's mother that patient should call back.  I explained on the voicemail that I have questions about details she probably does not have the answer to.

## 2014-12-08 NOTE — Telephone Encounter (Signed)
Yes

## 2014-12-08 NOTE — Telephone Encounter (Signed)
Patient reports that he is having blood in his stool for the last several days.  He reports that he is having blood on the tissue every time he wipes.  He states "not bright, but not dark".  He was treated by Dr. Drue Novel last lear for internal hemorrhoids.  Dr. Leone Payor I do not have any openings with anyone until 12/22/14.  He has history with you in 2007 has not been seen since.   Could we put him on the schedule on your hemorrhoidal banding day for eval and possible banding?

## 2014-12-09 NOTE — Telephone Encounter (Signed)
Patient notified he will come for possible hem banding 12/17/14 3:00

## 2014-12-17 ENCOUNTER — Encounter: Payer: Self-pay | Admitting: Internal Medicine

## 2014-12-17 ENCOUNTER — Ambulatory Visit (INDEPENDENT_AMBULATORY_CARE_PROVIDER_SITE_OTHER): Payer: 59 | Admitting: Internal Medicine

## 2014-12-17 VITALS — BP 122/60 | HR 72 | Ht 70.0 in | Wt 208.5 lb

## 2014-12-17 DIAGNOSIS — K602 Anal fissure, unspecified: Secondary | ICD-10-CM | POA: Diagnosis not present

## 2014-12-17 DIAGNOSIS — K644 Residual hemorrhoidal skin tags: Secondary | ICD-10-CM | POA: Insufficient documentation

## 2014-12-17 MED ORDER — DILTIAZEM GEL 2 %: 1.0000 "application " | Freq: Two times a day (BID) | CUTANEOUS | Status: DC

## 2014-12-17 NOTE — Assessment & Plan Note (Signed)
Left-posterior Tx wityh fiber and diltiazem gel 2% bid

## 2014-12-17 NOTE — Progress Notes (Signed)
   Subjective:    Patient ID: Phillip Harris, male    DOB: 01/23/1986, 29 y.o.   MRN: [id number] Cc: Rectal pain and bleeding HPI  Patient has been having pain with and after defecation - on left of anus. Tearing pain. Single streak of bright blood on the stool  Was on high protein and carb but no vegetable diet and was having 1 difficult stool a week. Then back to normal diet and began having 2-3 easier stools a week. Tried increasing fiber and taking psyllium but still with problems.   Medications, allergies, past medical history, past surgical history, family history and social history are reviewed and updated in the EMR.  Review of Systems As above    Objective:   Physical Exam BP 122/60 mmHg  Pulse 72  Ht 5\' 10"  (1.778 m)  Rectal - small RP tag, fleshy and violaceous. Exam shows anal spasm, mild but not tender. Some induration left posterior.  Anoscopy:  Left posterior fissure noted - some discomfort though premed with 0.125% NTG     Assessment & Plan:  Anal fissure Left-posterior Tx wityh fiber and diltiazem gel 2% bid   RTC 2 months

## 2014-12-17 NOTE — Patient Instructions (Addendum)
    We have faxed a rx for Diltiazem gel to [facility] for you to pick up.  Giving you the hard copy to take with you in case they didn't get it.  [employer] [employer] address: [address] [place], [place]   Today you have been given a handout to read and follow on benefiber.  Use 2-3 tablespoons daily.   Follow up with [place] in 2 months. Patient to call back and set up.    I appreciate the opportunity to care for you. Stan Head, MD, [facility]

## 2015-01-11 ENCOUNTER — Ambulatory Visit: Payer: Self-pay | Admitting: Internal Medicine

## 2015-04-20 ENCOUNTER — Encounter: Payer: Self-pay | Admitting: Internal Medicine

## 2015-04-20 ENCOUNTER — Ambulatory Visit (INDEPENDENT_AMBULATORY_CARE_PROVIDER_SITE_OTHER): Payer: 59 | Admitting: Internal Medicine

## 2015-04-20 VITALS — BP 102/64 | HR 64 | Temp 97.6°F | Ht 70.0 in | Wt 222.2 lb

## 2015-04-20 DIAGNOSIS — Z09 Encounter for follow-up examination after completed treatment for conditions other than malignant neoplasm: Secondary | ICD-10-CM | POA: Insufficient documentation

## 2015-04-20 DIAGNOSIS — F909 Attention-deficit hyperactivity disorder, unspecified type: Secondary | ICD-10-CM

## 2015-04-20 MED ORDER — AMPHETAMINE-DEXTROAMPHET ER 30 MG PO CP24: 30.0000 mg | ORAL_CAPSULE | Freq: Every day | ORAL | Status: DC | PRN

## 2015-04-20 NOTE — Progress Notes (Signed)
   Subjective:    Patient ID: Phillip Harris, male    DOB: 1986/04/30, 29 y.o.   MRN: [id number]  DOS:  04/20/2015 Type of visit - description : Checkup Interval history: Since the last office visit as he is taking Adderall, 30 mg daily is the dose that worked best for him. 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   Review of Systems Denies chest pain or palpitations No anxiety, depression or difficulty sleeping  Past Medical History  Diagnosis Date  . ADHD (attention deficit hyperactivity disorder)     Dr. Reggy EyeAltabet    Past Surgical History  Procedure Laterality Date  . Refractive surgery  2012  . Left forearm surgery  2004  . Right wrist surgery  2009    Social History   Social History  . Marital Status: Single    Spouse Name: N/A  . Number of Children: 0  . Years of Education: N/A   Occupational History  . EMS    Social History Main Topics  . Smoking status: Never Smoker   . Smokeless tobacco: Never Used  . Alcohol Use: 0.0 oz/week    0 Standard drinks or equivalent per week     Comment: occasionally  . Drug Use: No  . Sexual Activity: Not on file   Other Topics Concern  . Not on file   Social History Narrative        Medication List       This list is accurate as of: 04/20/15  6:49 PM.  Always use your most recent med list.               amphetamine-dextroamphetamine 30 MG 24 hr capsule  Commonly known as:  ADDERALL XR  Take 1 capsule (30 mg total) by mouth daily as needed.     diltiazem 2 % Gel  Apply 1 application topically 2 (two) times daily.           Objective:   Physical Exam BP 102/64 mmHg  Pulse 64  Temp(Src) 97.6 F (36.4 C) (Oral)  Ht 5\' 10"  (1.778 m)  Wt 222 lb 4 oz (100.812 kg)  BMI 31.89 kg/m2  SpO2 99% General:   Well developed, well nourished . NAD.  HEENT:  Normocephalic . Face symmetric, atraumatic Lungs:  CTA B Normal  respiratory effort, no intercostal retractions, no accessory muscle use. Heart: RRR,  no murmur.  No pretibial edema bilaterally  Skin: Not pale. Not jaundice Neurologic:  alert & oriented X3.  Speech normal, gait appropriate for age and unassisted Psych--  Cognition and judgment appear intact.  Cooperative with normal attention span and concentration.  Behavior appropriate. No anxious or depressed appearing.      Assessment & Plan:   Assessment> ADHD s/p eval  Dr. Reggy EyeAltabet 2016, Ritalin decreased appetite (as a child); Vyvanse trial 2016 cuased s/e    Plan: ADHD: Adderall 30 mg works well, prefers the extended release version of medication, prescription printed. Call for refills. Continue watching for side effects. UDS 6-16 low risk. RTC 6-8 months

## 2015-04-20 NOTE — Progress Notes (Signed)
Pre visit review using our clinic review tool, if applicable. No additional management support is needed unless otherwise documented below in the visit note. 

## 2015-04-20 NOTE — Assessment & Plan Note (Signed)
ADHD: Adderall 30 mg works well, prefers the extended release version of medication, prescription printed. Call for refills. Continue watching for side effects. UDS 6-16 low risk. RTC 6-8 months

## 2015-04-20 NOTE — Patient Instructions (Signed)
Take the medication as prescribed  Call for refills when needed  Next visit in 6-8 months, please make an appointment

## 2015-06-07 ENCOUNTER — Telehealth: Payer: Self-pay | Admitting: Internal Medicine

## 2015-06-07 MED ORDER — AMPHETAMINE-DEXTROAMPHET ER 30 MG PO CP24: 30.0000 mg | ORAL_CAPSULE | Freq: Every day | ORAL | Status: DC | PRN

## 2015-06-07 NOTE — Telephone Encounter (Signed)
Rx's printed for December 2016, January and February 2017, awaiting MD signature.

## 2015-06-07 NOTE — Telephone Encounter (Signed)
Caller name: Self   Can be reached: [phone]  [facility] [address], [place], [place] [address]  Phone: [phone]   Reason for call: Request refill on amphetamine-dextroamphetamine (ADDERALL XR) 30 MG 24 hr capsule [[id number]][[id number]] Wants to know if he can get more than one refill

## 2015-06-07 NOTE — Telephone Encounter (Signed)
Spoke with Pt, informed him that Rx's are ready for pick up at front desk at his convenience. Also informed him to place Rx's in safe place due to controlled substances. Pt verbalized understanding.

## 2015-06-07 NOTE — Telephone Encounter (Signed)
Okay print 3 prescriptions, needs to be very careful with them and keep them in a safe place

## 2015-06-07 NOTE — Telephone Encounter (Signed)
Pt is requesting refill on Adderall. Pt requesting more than 1 refill if possible.   Last OV: 04/20/2015  Last Fill: 04/20/2015 #30 and 0RF UDS: 11/26/2014 Low risk  Please advise.

## 2015-06-13 ENCOUNTER — Encounter: Payer: Self-pay | Admitting: Internal Medicine

## 2015-06-14 MED ORDER — DILTIAZEM GEL 2 %: 1.0000 "application " | Freq: Two times a day (BID) | CUTANEOUS | Status: DC

## 2015-06-14 NOTE — Telephone Encounter (Signed)
[facility]

## 2015-06-14 NOTE — Telephone Encounter (Signed)
Rx printed, awaiting to hear back from Pt regarding which pharmacy to send prescription to.

## 2015-06-24 ENCOUNTER — Encounter: Payer: Self-pay | Admitting: Internal Medicine

## 2015-07-30 ENCOUNTER — Encounter: Payer: Self-pay | Admitting: Internal Medicine

## 2015-10-31 ENCOUNTER — Other Ambulatory Visit: Payer: Self-pay | Admitting: Internal Medicine

## 2015-11-01 MED ORDER — AMPHETAMINE-DEXTROAMPHET ER 30 MG PO CP24
30.0000 mg | ORAL_CAPSULE | Freq: Every day | ORAL | Status: DC | PRN
Start: 2015-11-01 — End: 2016-02-16

## 2015-11-01 MED ORDER — AMPHETAMINE-DEXTROAMPHET ER 30 MG PO CP24: 30.0000 mg | ORAL_CAPSULE | Freq: Every day | ORAL | Status: DC | PRN

## 2015-11-01 NOTE — Telephone Encounter (Signed)
Pt informed via MyChart that Rx's have been placed at front desk for pick up at his convenience.  

## 2015-11-01 NOTE — Telephone Encounter (Signed)
Okay to refill #2 prescriptions

## 2015-11-01 NOTE — Telephone Encounter (Signed)
Rx's for May and June 2017 printed, awaiting MD signature.  

## 2015-11-01 NOTE — Telephone Encounter (Signed)
Pt is requesting refill on Adderall.  Last OV: 04/20/2015 Last Fill: 06/07/2015 #30 and 0RF For December 2016, January and February 2017 UDS:11/26/2014 Low risk  Please advise.

## 2015-11-12 ENCOUNTER — Ambulatory Visit (INDEPENDENT_AMBULATORY_CARE_PROVIDER_SITE_OTHER): Payer: 59 | Admitting: Internal Medicine

## 2015-11-12 ENCOUNTER — Encounter: Payer: Self-pay | Admitting: Internal Medicine

## 2015-11-12 VITALS — BP 112/74 | HR 64 | Temp 98.2°F | Ht 70.0 in | Wt 227.1 lb

## 2015-11-12 DIAGNOSIS — N342 Other urethritis: Secondary | ICD-10-CM | POA: Diagnosis not present

## 2015-11-12 DIAGNOSIS — F909 Attention-deficit hyperactivity disorder, unspecified type: Secondary | ICD-10-CM | POA: Diagnosis not present

## 2015-11-12 LAB — URINALYSIS, ROUTINE W REFLEX MICROSCOPIC
BILIRUBIN URINE: NEGATIVE
HGB URINE DIPSTICK: NEGATIVE
KETONES UR: NEGATIVE
LEUKOCYTES UA: NEGATIVE
NITRITE: NEGATIVE
RBC / HPF: NONE SEEN (ref 0–?)
Specific Gravity, Urine: 1.02 (ref 1.000–1.030)
TOTAL PROTEIN, URINE-UPE24: NEGATIVE
UROBILINOGEN UA: 0.2 (ref 0.0–1.0)
Urine Glucose: NEGATIVE
pH: 6 (ref 5.0–8.0)

## 2015-11-12 NOTE — Patient Instructions (Signed)
GO TO THE LAB : Get the blood work  and a urine test   GO TO THE FRONT DESK Schedule your next appointment for a  checkup in 8 months  You do have an additional prescription in file for Adderall at your pharmacy  If your symptoms continue, please call for an antibiotic.     Safe Sex Safe sex is about reducing the risk of giving or getting a sexually transmitted disease (STD). STDs are spread through sexual contact involving the genitals, mouth, or rectum. Some STDs can be cured and others cannot. Safe sex can also prevent unintended pregnancies.  WHAT ARE SOME SAFE SEX PRACTICES?  Limit your sexual activity to only one partner who is having sex with only you.  Talk to your partner about his or her past partners, past STDs, and drug use.  Use a condom every time you have sexual intercourse. This includes vaginal, oral, and anal sexual activity. Both females and males should wear condoms during oral sex. Only use latex or polyurethane condoms and water-based lubricants. Using petroleum-based lubricants or oils to lubricate a condom will weaken the condom and increase the chance that it will break. The condom should be in place from the beginning to the end of sexual activity. Wearing a condom reduces, but does not completely eliminate, your risk of getting or giving an STD. STDs can be spread by contact with infected body fluids and skin.  Get vaccinated for hepatitis B and HPV.  Avoid alcohol and recreational drugs, which can affect your judgment. You may forget to use a condom or participate in high-risk sex.  For females, avoid douching after sexual intercourse. Douching can spread an infection farther into the reproductive tract.  Check your body for signs of sores, blisters, rashes, or unusual discharge. See your health care provider if you notice any of these signs.  Avoid sexual contact if you have symptoms of an infection or are being treated for an STD. If you or your partner  has herpes, avoid sexual contact when blisters are present. Use condoms at all other times.  If you are at risk of being infected with HIV, it is recommended that you take a prescription medicine daily to prevent HIV infection. This is called pre-exposure prophylaxis (PrEP). You are considered at risk if:  You are a man who has sex with other men (MSM).  You are a heterosexual man or woman who is sexually active with more than one partner.  You take drugs by injection.  You are sexually active with a partner who has HIV.  Talk with your health care provider about whether you are at high risk of being infected with HIV. If you choose to begin PrEP, you should first be tested for HIV. You should then be tested every 3 months for as long as you are taking PrEP.  See your health care provider for regular screenings, exams, and tests for other STDs. Before having sex with a new partner, each of you should be screened for STDs and should talk about the results with each other. WHAT ARE THE BENEFITS OF SAFE SEX?   There is less chance of getting or giving an STD.  You can prevent unwanted or unintended pregnancies.  By discussing safe sex concerns with your partner, you may increase feelings of intimacy, comfort, trust, and honesty between the two of you.   This information is not intended to replace advice given to you by your health care provider. Make sure  you discuss any questions you have with your health care provider.   Document Released: 07/20/2004 Document Revised: 07/03/2014 Document Reviewed: 12/04/2011 Elsevier Interactive Patient Education [employer]2016 Elsevier Inc.

## 2015-11-12 NOTE — Progress Notes (Signed)
Subjective:    Patient ID: Phillip Harris, male    DOB: 1985/08/01, 30 y.o.   MRN: [id number]  DOS:  11/12/2015 Type of visit - description : Routine follow-up, he also has other concerns Interval history:  ADD: Taking medication regularly, very good results, no apparent side effects Also, for the last 2 weeks is having on and off dull pain at the urethra, at the tip and sometimes at the shaft. Sx are not necessarily worse with urination. Last time he had unprotected sex was 2 weeks prior to the onset of urinary symptoms.  Review of Systems  No fever chills No penile discharge No genital rash  Denies anxiety, depression, decreased appetite. He is sleeping well. No impulsivity or violent thoughts.  Past Medical History  Diagnosis Date  . ADHD (attention deficit hyperactivity disorder)     Dr. Reggy EyeAltabet    Past Surgical History  Procedure Laterality Date  . Refractive surgery  2012  . Left forearm surgery  2004  . Right wrist surgery  2009    Social History   Social History  . Marital Status: Single    Spouse Name: N/A  . Number of Children: 0  . Years of Education: N/A   Occupational History  . police officer    Social History Main Topics  . Smoking status: Never Smoker   . Smokeless tobacco: Never Used  . Alcohol Use: 0.0 oz/week    0 Standard drinks or equivalent per week     Comment: occasionally  . Drug Use: No  . Sexual Activity: Not on file   Other Topics Concern  . Not on file   Social History Narrative   Lives by himself         Medication List       This list is accurate as of: 11/12/15 11:59 PM.  Always use your most recent med list.               amphetamine-dextroamphetamine 30 MG 24 hr capsule  Commonly known as:  ADDERALL XR  Take 1 capsule (30 mg total) by mouth daily as needed.     amphetamine-dextroamphetamine 30 MG 24 hr capsule  Commonly known as:  ADDERALL XR  Take 1 capsule (30 mg total) by mouth daily as needed.     diltiazem 2 % Gel  Apply 1 application topically 2 (two) times daily.           Objective:   Physical Exam BP 112/74 mmHg  Pulse 64  Temp(Src) 98.2 F (36.8 C) (Oral)  Ht 5\' 10"  (1.778 m)  Wt 227 lb 2 oz (103.023 kg)  BMI 32.59 kg/m2  SpO2 97% General:   Well developed, well nourished . NAD.  HEENT:  Normocephalic . Face symmetric, atraumatic GU: Scrotal contents normal, penis without lesions, rash, ulcers, blisters. No discharge.   no inguinal LAD is Neurologic:  alert & oriented X3.  Speech normal, gait appropriate for age and unassisted Psych--  Cognition and judgment appear intact.  Cooperative with normal attention span and concentration.  Behavior appropriate. No anxious or depressed appearing.      Assessment & Plan:   Assessment> ADHD s/p eval  Dr. Reggy EyeAltabet 2016, Ritalin decreased appetite (as a child); Vyvanse trial 2016 cuased s/e    Plan: ADHD: Seems to be doing well, he just got one a supply and has a RF on file. Contract signed today. Urethritis: Sx c/w  urethritis, exam is negative, has + h/o STD x  1. Plan: UA, urine culture, gonorrhea and chlamydia in urine. HIV, RPR. If workup negative and symptoms persist consider antibiotics empirically. Safe sex discussed RTC 8 months

## 2015-11-12 NOTE — Progress Notes (Signed)
Pre visit review using our clinic review tool, if applicable. No additional management support is needed unless otherwise documented below in the visit note. 

## 2015-11-13 LAB — GC/CHLAMYDIA PROBE AMP
CT Probe RNA: NOT DETECTED
GC Probe RNA: NOT DETECTED

## 2015-11-13 LAB — HIV ANTIBODY (ROUTINE TESTING W REFLEX): HIV 1&2 Ab, 4th Generation: NONREACTIVE

## 2015-11-13 LAB — SYPHILIS: RPR W/REFLEX TO RPR TITER AND TREPONEMAL ANTIBODIES, TRADITIONAL SCREENING AND DIAGNOSIS ALGORITHM

## 2015-11-14 LAB — URINE CULTURE
Colony Count: NO GROWTH
Organism ID, Bacteria: NO GROWTH

## 2015-11-14 NOTE — Assessment & Plan Note (Signed)
ADHD: Seems to be doing well, he just got one a supply and has a RF on file. Contract signed today. Urethritis: Sx c/w  urethritis, exam is negative, has + h/o STD x 1. Plan: UA, urine culture, gonorrhea and chlamydia in urine. HIV, RPR. If workup negative and symptoms persist consider antibiotics empirically. Safe sex discussed RTC 8 months

## 2015-11-15 ENCOUNTER — Encounter: Payer: Self-pay | Admitting: Internal Medicine

## 2015-11-16 ENCOUNTER — Other Ambulatory Visit: Payer: Self-pay | Admitting: Internal Medicine

## 2015-11-16 MED ORDER — AZITHROMYCIN 250 MG PO TABS: 1000.0000 mg | ORAL_TABLET | Freq: Once | ORAL | Status: DC

## 2016-02-16 ENCOUNTER — Encounter: Payer: Self-pay | Admitting: Internal Medicine

## 2016-02-16 DIAGNOSIS — Z113 Encounter for screening for infections with a predominantly sexual mode of transmission: Secondary | ICD-10-CM

## 2016-02-16 MED ORDER — AMPHETAMINE-DEXTROAMPHET ER 30 MG PO CP24: 30.0000 mg | ORAL_CAPSULE | Freq: Every day | ORAL | 0 refills | Status: DC | PRN

## 2016-02-16 NOTE — Telephone Encounter (Signed)
Pt is requesting refill on Adderall.  Last OV: 11/12/2015 Last Fill: 11/01/2015 #30 and 0RF For May and June 2017 UDS: 11/26/2014 Low risk  Pt also requesting orders for STD screening/check. See Pt's MyChart message.  Please advise.

## 2016-02-16 NOTE — Telephone Encounter (Signed)
Pt informed via MyChart that Rx's placed at front desk for pick up at Pt's convenience.

## 2016-02-16 NOTE — Telephone Encounter (Signed)
Rx's for August and September 2017 printed, awaiting MD signature. Okay per PCP to order urine cytology.

## 2016-02-16 NOTE — Telephone Encounter (Signed)
Okay to refill, 2 prescriptions. Advise patient, STD check few months ago negative, okay to recheck HIV if so desired.

## 2016-02-21 ENCOUNTER — Encounter: Payer: Self-pay | Admitting: Internal Medicine

## 2016-02-29 ENCOUNTER — Telehealth: Payer: Self-pay

## 2016-02-29 NOTE — Telephone Encounter (Signed)
UDS: 02/21/2016  Negative for Adderall:PRN   Low risk per Dr. Drue NovelPaz 02/29/2016

## 2016-03-10 ENCOUNTER — Encounter: Payer: Self-pay | Admitting: Internal Medicine

## 2016-05-11 ENCOUNTER — Encounter: Payer: Self-pay | Admitting: Internal Medicine

## 2016-05-11 ENCOUNTER — Ambulatory Visit (INDEPENDENT_AMBULATORY_CARE_PROVIDER_SITE_OTHER): Payer: 59 | Admitting: Internal Medicine

## 2016-05-11 VITALS — BP 112/72 | HR 83 | Temp 97.9°F | Resp 14 | Ht 70.0 in | Wt 237.4 lb

## 2016-05-11 DIAGNOSIS — B9789 Other viral agents as the cause of diseases classified elsewhere: Secondary | ICD-10-CM

## 2016-05-11 DIAGNOSIS — J069 Acute upper respiratory infection, unspecified: Secondary | ICD-10-CM | POA: Diagnosis not present

## 2016-05-11 MED ORDER — AMOXICILLIN 500 MG PO CAPS: 1000.0000 mg | ORAL_CAPSULE | Freq: Two times a day (BID) | ORAL | 0 refills | Status: DC

## 2016-05-11 NOTE — Progress Notes (Signed)
   Subjective:    Patient ID: Phillip Harris, male    DOB: May 01, 1986, 30 y.o.   MRN: [id number]  DOS:  05/11/2016 Type of visit - description : Acute visit Symptoms started 2 days ago with nasal congestion, cough, wheezing?. + Malaise, couldn't work yesterday. Subjective fever last night along with some sweats. is taking DayQuil and NyQuil with partial relief of symptoms.   Review of Systems No major sore throats. Denies difficulty breathing, nausea, vomiting. No rash Symptoms are the worst in the morning, he patient sees yellow sputum. No headaches.  Past Medical History:  Diagnosis Date  . ADHD (attention deficit hyperactivity disorder)    Dr. Reggy EyeAltabet    Past Surgical History:  Procedure Laterality Date  . Left forearm Surgery  2004  . REFRACTIVE SURGERY  2012  . Right Wrist Surgery  2009    Social History   Social History  . Marital status: Single    Spouse name: N/A  . Number of children: 0  . Years of education: N/A   Occupational History  . police officer    Social History Main Topics  . Smoking status: Never Smoker  . Smokeless tobacco: Never Used  . Alcohol use 0.0 oz/week     Comment: occasionally  . Drug use: No  . Sexual activity: Not on file   Other Topics Concern  . Not on file   Social History Narrative   Lives by himself         Medication List       Accurate as of 05/11/16 11:59 PM. Always use your most recent med list.          amoxicillin 500 MG capsule Commonly known as:  AMOXIL Take 2 capsules (1,000 mg total) by mouth 2 (two) times daily.   amphetamine-dextroamphetamine 30 MG 24 hr capsule Commonly known as:  ADDERALL XR Take 1 capsule (30 mg total) by mouth daily as needed.   amphetamine-dextroamphetamine 30 MG 24 hr capsule Commonly known as:  ADDERALL XR Take 1 capsule (30 mg total) by mouth daily as needed.          Objective:   Physical Exam BP 112/72 (BP Location: Left Arm, Patient Position: Sitting,  Cuff Size: Normal)   Pulse 83   Temp 97.9 F (36.6 C) (Oral)   Resp 14   Ht 5\' 10"  (1.778 m)   Wt 237 lb 6 oz (107.7 kg)   SpO2 97%   BMI 34.06 kg/m  General:   Well developed, well nourished . NAD.  HEENT:  Normocephalic . Face symmetric, atraumatic TMs: No red, bulging. Throat symmetric, no redness no white patches. Nose slightly congested, sinuses no TTP Lungs:  CTA B (minimal increase in the expiratory time without wheezing) Normal respiratory effort, no intercostal retractions, no accessory muscle use. Heart: RRR,  no murmur.  No pretibial edema bilaterally  Skin: Not pale. Not jaundice Neurologic:  alert & oriented X3.  Speech normal, gait appropriate for age and unassisted Psych--  Cognition and judgment appear intact.  Cooperative with normal attention span and concentration.  Behavior appropriate. No anxious or depressed appearing.      Assessment & Plan:   Assessment ADHD s/p eval  Dr. Reggy EyeAltabet 2016, Ritalin decreased appetite (as a child); Vyvanse trial 2016 cuased s/e   PLAN:  URI, likely viral, conservative treatment, antibiotics only if not better. See AVS

## 2016-05-11 NOTE — Patient Instructions (Signed)
Rest, fluids , tylenol  For cough:  Continue Dayquil and Nyquil  For nasal congestion: Use OTC Nasocort or Flonase : 2 nasal sprays on each side of the nose in the morning until you feel better    Take the antibiotic as prescribed  (Amoxicillin) only if no better in 4-5 days  Call if not gradually better over the next  10 days  Call anytime if the symptoms are severe

## 2016-05-11 NOTE — Progress Notes (Signed)
Pre visit review using our clinic review tool, if applicable. No additional management support is needed unless otherwise documented below in the visit note. 

## 2016-05-12 NOTE — Assessment & Plan Note (Signed)
URI, likely viral, conservative treatment, antibiotics only if not better. See AVS

## 2016-07-14 ENCOUNTER — Ambulatory Visit: Payer: Self-pay | Admitting: Internal Medicine

## 2016-07-18 DIAGNOSIS — Z0100 Encounter for examination of eyes and vision without abnormal findings: Secondary | ICD-10-CM | POA: Diagnosis not present

## 2016-09-04 ENCOUNTER — Ambulatory Visit: Payer: Self-pay | Admitting: Internal Medicine

## 2016-09-12 ENCOUNTER — Encounter: Payer: Self-pay | Admitting: Internal Medicine

## 2016-09-12 ENCOUNTER — Ambulatory Visit (INDEPENDENT_AMBULATORY_CARE_PROVIDER_SITE_OTHER): Payer: 59 | Admitting: Internal Medicine

## 2016-09-12 VITALS — BP 118/70 | HR 90 | Temp 98.0°F | Resp 14 | Ht 70.0 in | Wt 227.0 lb

## 2016-09-12 DIAGNOSIS — J40 Bronchitis, not specified as acute or chronic: Secondary | ICD-10-CM

## 2016-09-12 DIAGNOSIS — F909 Attention-deficit hyperactivity disorder, unspecified type: Secondary | ICD-10-CM | POA: Diagnosis not present

## 2016-09-12 MED ORDER — AZITHROMYCIN 250 MG PO TABS: ORAL_TABLET | ORAL | 0 refills | Status: DC

## 2016-09-12 MED ORDER — AMPHETAMINE-DEXTROAMPHET ER 30 MG PO CP24: 30.0000 mg | ORAL_CAPSULE | Freq: Every day | ORAL | 0 refills | Status: DC | PRN

## 2016-09-12 NOTE — Assessment & Plan Note (Signed)
Likely bronchitis, will recommend supportive treatment and a Z-Pak. See instructions. ADD: Doing well, refills provided.

## 2016-09-12 NOTE — Progress Notes (Signed)
Pre visit review using our clinic review tool, if applicable. No additional management support is needed unless otherwise documented below in the visit note. 

## 2016-09-12 NOTE — Progress Notes (Signed)
   Subjective:    Patient ID: Phillip Harris, male    DOB: June 16, 1986, 31 y.o.   MRN: [id number]  DOS:  09/12/2016 Type of visit - description : acute Interval history: Symptoms started 10 days ago with sore throat, nose congestion, green thick nasal discharge. The first 2 days he also felt very weak and fatigued, that is better. NyQuil helps. + Sputum, thick green and abundant.  Review of Systems  Denies fever chills. The first couple of days he was sneezing and had itchy eyes. No myalgias.  Past Medical History:  Diagnosis Date  . ADHD (attention deficit hyperactivity disorder)    Dr. Reggy EyeAltabet    Past Surgical History:  Procedure Laterality Date  . Left forearm Surgery  2004  . REFRACTIVE SURGERY  2012  . Right Wrist Surgery  2009    Social History   Social History  . Marital status: Single    Spouse name: N/A  . Number of children: 0  . Years of education: N/A   Occupational History  . police officer    Social History Main Topics  . Smoking status: Never Smoker  . Smokeless tobacco: Never Used  . Alcohol use 0.0 oz/week     Comment: occasionally  . Drug use: No  . Sexual activity: Not on file   Other Topics Concern  . Not on file   Social History Narrative   Lives by himself       Allergies as of 09/12/2016   No Known Allergies     Medication List       Accurate as of 09/12/16  6:50 PM. Always use your most recent med list.          amphetamine-dextroamphetamine 30 MG 24 hr capsule Commonly known as:  ADDERALL XR Take 1 capsule (30 mg total) by mouth daily as needed.   amphetamine-dextroamphetamine 30 MG 24 hr capsule Commonly known as:  ADDERALL XR Take 1 capsule (30 mg total) by mouth daily as needed.   azithromycin 250 MG tablet Commonly known as:  ZITHROMAX Z-PAK 2 tabs a day the first day, then 1 tab a day x 4 days          Objective:   Physical Exam BP 118/70 (BP Location: Left Arm, Patient Position: Sitting, Cuff Size:  Normal)   Pulse 90   Temp 98 F (36.7 C) (Oral)   Resp 14   Ht 5\' 10"  (1.778 m)   Wt 227 lb (103 kg) Comment: Per Pt  SpO2 97%   BMI 32.57 kg/m  General:   Well developed, well nourished . NAD.  HEENT:  Normocephalic . Face symmetric, atraumatic. TMs without redness, no bulging. Nose is slightly congested, sinuses no TTP. Throat symmetric not read. Lungs:  Frequent cough noted but no rhonchi. Normal respiratory effort, no intercostal retractions, no accessory muscle use. Heart: RRR,  no murmur.  No pretibial edema bilaterally  Skin: Not pale. Not jaundice Neurologic:  alert & oriented X3.  Speech normal, gait appropriate for age and unassisted Psych--  Cognition and judgment appear intact.  Cooperative with normal attention span and concentration.  Behavior appropriate. No anxious or depressed appearing.      Assessment & Plan:    Assessment ADHD s/p eval  Dr. Reggy EyeAltabet 2016, Ritalin decreased appetite (as a child); Vyvanse trial 2016 cuased s/e   PLAN:  Likely bronchitis, will recommend supportive treatment and a Z-Pak. See instructions. ADD: Doing well, refills provided.

## 2016-09-12 NOTE — Patient Instructions (Signed)
Rest, fluids , tylenol  For cough:  Take Mucinex DM t in the mornings Ok Nyquil  For night   For nasal congestion: Use OTC Nasocort or Flonase : 2 nasal sprays on each side of the nose in the morning until you feel better   Avoid decongestants such as  Pseudoephedrine or phenylephrine     Take the antibiotic as prescribed    Call if not gradually better over the next  10 days  Call anytime if the symptoms are severe

## 2016-09-25 ENCOUNTER — Telehealth: Payer: Self-pay | Admitting: Internal Medicine

## 2016-09-25 MED ORDER — HYDROCODONE-HOMATROPINE 5-1.5 MG/5ML PO SYRP: 5.0000 mL | ORAL_SOLUTION | Freq: Every evening | ORAL | 0 refills | Status: DC | PRN

## 2016-09-25 MED ORDER — AMOXICILLIN 875 MG PO TABS: 875.0000 mg | ORAL_TABLET | Freq: Two times a day (BID) | ORAL | 0 refills | Status: DC

## 2016-09-25 NOTE — Telephone Encounter (Signed)
Will prescribe amoxicillin, his second round of antibiotics. Prescription sent. For cough suppression: Mucinex DM twice a day, I also printed a prescription for hydrocodone to be taken only at night for few days as needed for persisting nocturnal cough.

## 2016-09-25 NOTE — Telephone Encounter (Signed)
Spoke w/ Pt, informed that amoxicillin has been sent to [facility] [place] ready for pick up at front desk. Pt informed to let us know if not improving. Pt verbalized understanding.

## 2016-09-25 NOTE — Telephone Encounter (Signed)
Caller name: Mccabe Gloria Relationship to patient: self Can be reached: [phone] Pharmacy: [id number] - [place], [place] - [address]  Reason for call: Pt stated he was advised to f/u if not better after abx. He completed zpak and sx have come back. No fatigue but has cough, congestion, and yellow mucus. Pt asking for abx and possible cough suppressant.

## 2016-09-25 NOTE — Telephone Encounter (Signed)
Please advise 

## 2016-11-22 ENCOUNTER — Ambulatory Visit (INDEPENDENT_AMBULATORY_CARE_PROVIDER_SITE_OTHER): Payer: 59 | Admitting: Urgent Care

## 2016-11-22 ENCOUNTER — Encounter: Payer: Self-pay | Admitting: Urgent Care

## 2016-11-22 VITALS — BP 127/84 | HR 70 | Temp 98.5°F | Resp 18 | Ht 70.0 in | Wt 225.0 lb

## 2016-11-22 DIAGNOSIS — Z021 Encounter for pre-employment examination: Secondary | ICD-10-CM

## 2016-11-22 DIAGNOSIS — Z1159 Encounter for screening for other viral diseases: Secondary | ICD-10-CM

## 2016-11-22 DIAGNOSIS — Z0000 Encounter for general adult medical examination without abnormal findings: Secondary | ICD-10-CM | POA: Diagnosis not present

## 2016-11-22 NOTE — Patient Instructions (Addendum)

## 2016-11-22 NOTE — Progress Notes (Signed)
MRN: [id number] DOB: [birth year]  Subjective:   Phillip Harris is a 31 y.o. male for Employment Physical (Pt has form)  Patient works with the [employer]. He would like to have his form completed for this. He states that he does not want blood work for his annual exam. He has already had biometrics done. Denies smoking cigarettes. He has ~5 drinks per week.  PCP: Willow OraJose Paz, MD. Vision: No visual deficits.   Phillip Harris has a current medication list which includes the following prescription(s): amphetamine-dextroamphetamine, amphetamine-dextroamphetamine, and hydrocodone-homatropine. Also has No Known Allergies.  Phillip Harris  has a past medical history of ADHD (attention deficit hyperactivity disorder). Also  has a past surgical history that includes Refractive surgery (2012); Left forearm Surgery (2004); Right Wrist Surgery (2009); Eye surgery (Bilateral, 2012); and Fracture surgery. His family history includes Diabetes in his mother.   Review of Systems  Constitutional: Negative for chills, diaphoresis, fever, malaise/fatigue and weight loss.  HENT: Negative for congestion, ear discharge, ear pain, hearing loss, nosebleeds, sore throat and tinnitus.   Eyes: Negative for blurred vision, double vision, photophobia, pain, discharge and redness.  Respiratory: Negative for cough, shortness of breath and wheezing.   Cardiovascular: Negative for chest pain, palpitations and leg swelling.  Gastrointestinal: Negative for abdominal pain, blood in stool, constipation, diarrhea, nausea and vomiting.  Genitourinary: Negative for dysuria, flank pain, frequency, hematuria and urgency.  Musculoskeletal: Negative for back pain, joint pain and myalgias.  Skin: Negative for itching and rash.  Neurological: Negative for dizziness, tingling, seizures, loss of consciousness, weakness and headaches.  Endo/Heme/Allergies: Negative for polydipsia.  Psychiatric/Behavioral: Negative for depression,  hallucinations, memory loss, substance abuse and suicidal ideas. The patient is not nervous/anxious and does not have insomnia.    Objective:   Vitals: BP 127/84   Pulse 70   Temp 98.5 F (36.9 C) (Oral)   Resp 18   Ht 5\' 10"  (1.778 m)   Wt 225 lb (102.1 kg)   SpO2 96%   BMI 32.28 kg/m    Visual Acuity Screening   Right eye Left eye Both eyes  Without correction: 20/10 20/10 20/10   With correction:     Comments: Had laser eye surgery 2012 Bilateral  Physical Exam  Constitutional: He is oriented to person, place, and time. He appears well-developed and well-nourished.  HENT:  TM's intact bilaterally, no effusions or erythema. Nasal turbinates pink and moist, nasal passages patent. No sinus tenderness. Oropharynx clear, mucous membranes moist, dentition in good repair.  Eyes: Conjunctivae and EOM are normal. Pupils are equal, round, and reactive to light. Right eye exhibits no discharge. Left eye exhibits no discharge. No scleral icterus.  Neck: Normal range of motion. Neck supple. No thyromegaly present.  Cardiovascular: Normal rate, regular rhythm and intact distal pulses.  Exam reveals no gallop and no friction rub.   No murmur heard. Pulmonary/Chest: No stridor. No respiratory distress. He has no wheezes. He has no rales.  Abdominal: Soft. Bowel sounds are normal. He exhibits no distension and no mass. There is no tenderness.  Musculoskeletal: Normal range of motion. He exhibits no edema or tenderness.  Lymphadenopathy:    He has no cervical adenopathy.  Neurological: He is alert and oriented to person, place, and time. He has normal reflexes.  Skin: Skin is warm and dry. No rash noted. No erythema. No pallor.  Psychiatric: He has a normal mood and affect.   Assessment and Plan :   1. Annual physical exam 2. Physical exam, pre-employment -  Medically healthy. Cleared to have his physical fitness test performed. Will follow up with labs below.  3. Need for hepatitis B  screening test - Hepatitis B surface antibody - Hepatitis B surface antigen   Wallis Bamberg, PA-C Primary Care at [facility] [phone] 11/22/2016  12:15 PM

## 2016-11-23 LAB — HEPATITIS B SURFACE ANTIBODY, QUANTITATIVE

## 2016-11-23 LAB — HEPATITIS B SURFACE ANTIGEN: Hepatitis B Surface Ag: NEGATIVE

## 2017-07-03 ENCOUNTER — Encounter: Payer: Self-pay | Admitting: Internal Medicine

## 2017-07-03 ENCOUNTER — Other Ambulatory Visit: Payer: Self-pay | Admitting: Internal Medicine

## 2017-07-04 ENCOUNTER — Ambulatory Visit: Payer: 59 | Admitting: Internal Medicine

## 2017-07-04 ENCOUNTER — Encounter: Payer: Self-pay | Admitting: Internal Medicine

## 2017-07-04 VITALS — BP 132/80 | HR 82 | Temp 98.2°F | Resp 14 | Ht 70.0 in | Wt 248.4 lb

## 2017-07-04 DIAGNOSIS — F909 Attention-deficit hyperactivity disorder, unspecified type: Secondary | ICD-10-CM | POA: Diagnosis not present

## 2017-07-04 DIAGNOSIS — Z79899 Other long term (current) drug therapy: Secondary | ICD-10-CM

## 2017-07-04 DIAGNOSIS — J069 Acute upper respiratory infection, unspecified: Secondary | ICD-10-CM

## 2017-07-04 MED ORDER — AMPHETAMINE-DEXTROAMPHET ER 30 MG PO CP24: 30.0000 mg | ORAL_CAPSULE | Freq: Every day | ORAL | 0 refills | Status: DC | PRN

## 2017-07-04 MED ORDER — AMOXICILLIN 875 MG PO TABS: 875.0000 mg | ORAL_TABLET | Freq: Two times a day (BID) | ORAL | 0 refills | Status: DC
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
## 2017-07-04 NOTE — Patient Instructions (Signed)
GO TO THE LAB : Please provide a urine sample   GO TO THE FRONT DESK Schedule your next appointment for a checkup in 6 months =====   Rest, fluids , tylenol  For cough:  Take Mucinex DM twice a day as needed until better  If  nasal congestion: Use OTC Nasocort or Flonase : 2 nasal sprays on each side of the nose in the morning until you feel better  Take the antibiotic as prescribed  (Amoxicillin) only if not improving in 5 days  Call if not gradually better over the next  10 days  Call anytime if the symptoms are severe

## 2017-07-04 NOTE — Progress Notes (Signed)
Pre visit review using our clinic review tool, if applicable. No additional management support is needed unless otherwise documented below in the visit note. 

## 2017-07-04 NOTE — Assessment & Plan Note (Signed)
ADD: Well-controlled, RF sent, UDS and contract today URI: Conservative treatment, if not better, recommend amoxicillin.  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

## 2017-07-05 LAB — PAIN MGMT, PROFILE 8 W/CONF, U
6 Acetylmorphine: NEGATIVE ng/mL (ref <10)
ALCOHOL METABOLITES: NEGATIVE ng/mL (ref <500)
Amphetamines: NEGATIVE ng/mL (ref <500)
Benzodiazepines: NEGATIVE ng/mL (ref <100)
Buprenorphine, Urine: NEGATIVE ng/mL (ref <5)
COCAINE METABOLITE: NEGATIVE ng/mL (ref <150)
CREATININE: 123.9 mg/dL
MARIJUANA METABOLITE: NEGATIVE ng/mL (ref <20)
MDMA: NEGATIVE ng/mL (ref <500)
OXYCODONE: NEGATIVE ng/mL (ref <100)
Opiates: NEGATIVE ng/mL (ref <100)
Oxidant: NEGATIVE ug/mL (ref <200)
PH: 7.91 (ref 4.5–9.0)

## 2018-01-01 ENCOUNTER — Ambulatory Visit: Payer: 59 | Admitting: Internal Medicine

## 2018-01-11 ENCOUNTER — Encounter: Payer: Self-pay | Admitting: Internal Medicine

## 2018-07-09 ENCOUNTER — Ambulatory Visit (HOSPITAL_BASED_OUTPATIENT_CLINIC_OR_DEPARTMENT_OTHER)
Admission: RE | Admit: 2018-07-09 | Discharge: 2018-07-09 | Disposition: A | Discharge status: Home / Self Care | Payer: 59 | Source: Ambulatory Visit | Attending: Internal Medicine | Admitting: Internal Medicine

## 2018-07-09 ENCOUNTER — Encounter: Payer: Self-pay | Admitting: Internal Medicine

## 2018-07-09 ENCOUNTER — Ambulatory Visit (INDEPENDENT_AMBULATORY_CARE_PROVIDER_SITE_OTHER): Payer: 59 | Admitting: Internal Medicine

## 2018-07-09 VITALS — BP 118/68 | HR 108 | Temp 99.8°F | Resp 16 | Ht 70.0 in | Wt 250.4 lb

## 2018-07-09 DIAGNOSIS — B349 Viral infection, unspecified: Secondary | ICD-10-CM

## 2018-07-09 DIAGNOSIS — R05 Cough: Secondary | ICD-10-CM | POA: Diagnosis not present

## 2018-07-09 LAB — POCT INFLUENZA A/B
Influenza A, POC: NEGATIVE
Influenza B, POC: NEGATIVE

## 2018-07-09 LAB — POCT RAPID STREP A (OFFICE): RAPID STREP A SCREEN: NEGATIVE

## 2018-07-09 MED ORDER — HYDROCODONE-HOMATROPINE 5-1.5 MG/5ML PO SYRP: 5.0000 mL | ORAL_SOLUTION | Freq: Two times a day (BID) | ORAL | 0 refills | Status: DC | PRN

## 2018-07-09 MED ORDER — OSELTAMIVIR PHOSPHATE 75 MG PO CAPS: 75.0000 mg | ORAL_CAPSULE | Freq: Two times a day (BID) | ORAL | 0 refills | Status: DC

## 2018-07-09 NOTE — Assessment & Plan Note (Signed)
Viral syndrome: Flu test negative, rapid strep test negative. Still suspect influenza, PNM is in the  DDX. Plan: Tamiflu, chest x-ray, rest, cough control with hydrocodone, see AVS. Call if not gradually better He is contagious, has a 73-month-old baby at home, recommend to call pediatrician today for possible Tamiflu, precautions discussed Social: He is working at [employer], has a 27-month-old baby at home. RTC 6 months

## 2018-07-09 NOTE — Progress Notes (Signed)
Pre visit review using our clinic review tool, if applicable. No additional management support is needed unless otherwise documented below in the visit note. 

## 2018-07-09 NOTE — Patient Instructions (Signed)
Get your chest x-ray  You are contagious  ===  Rest, fluids , tylenol  For cough:  Take Mucinex DM twice a day as needed until better Also use hydrocodone as needed, will make you drowsy    Take Tamiflu as prescribed  Call if not gradually better over the next  10 days  Call anytime if the symptoms are severe, you have high fever, short of breath, chest pain   Influenza, Adult Influenza, more commonly known as "the flu," is a viral infection that mainly affects the respiratory tract. The respiratory tract includes organs that help you breathe, such as the lungs, nose, and throat. The flu causes many symptoms similar to the common cold along with high fever and body aches. The flu spreads easily from person to person (is contagious). Getting a flu shot (influenza vaccination) every year is the best way to prevent the flu. What are the causes? This condition is caused by the influenza virus. You can get the virus by:  Breathing in droplets that are in the air from an infected person's cough or sneeze.  Touching something that has been exposed to the virus (has been contaminated) and then touching your mouth, nose, or eyes. What increases the risk? The following factors may make you more likely to get the flu:  Not washing or sanitizing your hands often.  Having close contact with many people during cold and flu season.  Touching your mouth, eyes, or nose without first washing or sanitizing your hands.  Not getting a yearly (annual) flu shot. You may have a higher risk for the flu, including serious problems such as a lung infection (pneumonia), if you:  Are older than 65.  Are pregnant.  Have a weakened disease-fighting system (immune system). You may have a weakened immune system if you: ? Have HIV or AIDS. ? Are undergoing chemotherapy. ? Are taking medicines that reduce (suppress) the activity of your immune system.  Have a long-term (chronic) illness, such as heart  disease, kidney disease, diabetes, or lung disease.  Have a liver disorder.  Are severely overweight (morbidly obese).  Have anemia. This is a condition that affects your red blood cells.  Have asthma. What are the signs or symptoms? Symptoms of this condition usually begin suddenly and last 4-14 days. They may include:  Fever and chills.  Headaches, body aches, or muscle aches.  Sore throat.  Cough.  Runny or stuffy (congested) nose.  Chest discomfort.  Poor appetite.  Weakness or fatigue.  Dizziness.  Nausea or vomiting. How is this diagnosed? This condition may be diagnosed based on:  Your symptoms and medical history.  A physical exam.  Swabbing your nose or throat and testing the fluid for the influenza virus. How is this treated? If the flu is diagnosed early, you can be treated with medicine that can help reduce how severe the illness is and how long it lasts (antiviral medicine). This may be given by mouth (orally) or through an IV. Taking care of yourself at home can help relieve symptoms. Your health care provider may recommend:  Taking over-the-counter medicines.  Drinking plenty of fluids. In many cases, the flu goes away on its own. If you have severe symptoms or complications, you may be treated in a hospital. Follow these instructions at home: Activity  Rest as needed and get plenty of sleep.  Stay home from work or school as told by your health care provider. Unless you are visiting your health care provider,  avoid leaving home until your fever has been gone for 24 hours without taking medicine. Eating and drinking  Take an oral rehydration solution (ORS). This is a drink that is sold at pharmacies and retail stores.  Drink enough fluid to keep your urine pale yellow.  Drink clear fluids in small amounts as you are able. Clear fluids include water, ice chips, diluted fruit juice, and low-calorie sports drinks.  Eat bland, easy-to-digest  foods in small amounts as you are able. These foods include bananas, applesauce, rice, lean meats, toast, and crackers.  Avoid drinking fluids that contain a lot of sugar or caffeine, such as energy drinks, regular sports drinks, and soda.  Avoid alcohol.  Avoid spicy or fatty foods. General instructions      Take over-the-counter and prescription medicines only as told by your health care provider.  Use a cool mist humidifier to add humidity to the air in your home. This can make it easier to breathe.  Cover your mouth and nose when you cough or sneeze.  Wash your hands with soap and water often, especially after you cough or sneeze. If soap and water are not available, use alcohol-based hand sanitizer.  Keep all follow-up visits as told by your health care provider. This is important. How is this prevented?   Get an annual flu shot. You may get the flu shot in late summer, fall, or winter. Ask your health care provider when you should get your flu shot.  Avoid contact with people who are sick during cold and flu season. This is generally fall and winter. Contact a health care provider if:  You develop new symptoms.  You have: ? Chest pain. ? Diarrhea. ? A fever.  Your cough gets worse.  You produce more mucus.  You feel nauseous or you vomit. Get help right away if:  You develop shortness of breath or difficulty breathing.  Your skin or nails turn a bluish color.  You have severe pain or stiffness in your neck.  You develop a sudden headache or sudden pain in your face or ear.  You cannot eat or drink without vomiting. Summary  Influenza, more commonly known as "the flu," is a viral infection that primarily affects your respiratory tract.  Symptoms of the flu usually begin suddenly and last 4-14 days.  Getting an annual flu shot is the best way to prevent getting the flu.  Stay home from work or school as told by your health care provider. Unless you are  visiting your health care provider, avoid leaving home until your fever has been gone for 24 hours without taking medicine.  Keep all follow-up visits as told by your health care provider. This is important. This information is not intended to replace advice given to you by your health care provider. Make sure you discuss any questions you have with your health care provider. Document Released: 06/09/2000 Document Revised: 11/28/2017 Document Reviewed: 11/28/2017 Elsevier Interactive Patient Education  2019 [employer].

## 2018-07-09 NOTE — Progress Notes (Signed)
Subjective:    Patient ID: Phillip Harris, male    DOB: January 25, 1986, 33 y.o.   MRN: [id number]  DOS:  07/09/2018 Type of visit - description: Acute For the last 4 weeks he has on and off cough w/ sputum production. He was not feeling sick other than that.  Symptoms started suddenly at 1 AM 07/08/2018: Shaking, severe cough, sweats, fever, aches, fatigue, symptoms lasted a couple of hours until he went to sleep, he slept for 12 hours.  Now, he continue with a sporadic cough, no further aches, not feeling feverish.  He does have some sputum production, color?   Review of Systems Denies nausea, vomiting, diarrhea No rash No headaches except for mild head pressure with cough. Has some sinus pressure and congestion but no nasal discharge.   Past Medical History:  Diagnosis Date  . ADHD (attention deficit hyperactivity disorder)    Dr. Reggy Eye    Past Surgical History:  Procedure Laterality Date  . EYE SURGERY Bilateral 2012   Laser Eye Surgery   . FRACTURE SURGERY    . Left forearm Surgery  2004  . REFRACTIVE SURGERY  2012  . Right Wrist Surgery  2009    Social History   Socioeconomic History  . Marital status: Single    Spouse name: Not on file  . Number of children: 0  . Years of education: Not on file  . Highest education level: Not on file  Occupational History  . Occupation: Technical sales engineer, Technical sales engineer  Social Needs  . Financial resource strain: Not on file  . Food insecurity:    Worry: Not on file    Inability: Not on file  . Transportation needs:    Medical: Not on file    Non-medical: Not on file  Tobacco Use  . Smoking status: Never Smoker  . Smokeless tobacco: Never Used  Substance and Sexual Activity  . Alcohol use: Yes    Alcohol/week: 0.0 standard drinks    Comment: occasionally  . Drug use: No  . Sexual activity: Not on file  Lifestyle  . Physical activity:    Days per week: Not on file    Minutes per session: Not on file  . Stress:  Not on file  Relationships  . Social connections:    Talks on phone: Not on file    Gets together: Not on file    Attends religious service: Not on file    Active member of club or organization: Not on file    Attends meetings of clubs or organizations: Not on file    Relationship status: Not on file  . Intimate partner violence:    Fear of current or ex partner: Not on file    Emotionally abused: Not on file    Physically abused: Not on file    Forced sexual activity: Not on file  Other Topics Concern  . Not on file  Social History Narrative    Lives with  fiancee   1 child born 2019      Allergies as of 07/09/2018   No Known Allergies     Medication List       Accurate as of July 09, 2018  7:07 PM. Always use your most recent med list.        HYDROcodone-homatropine 5-1.5 MG/5ML syrup Commonly known as:  HYCODAN Take 5 mLs by mouth 2 (two) times daily as needed for cough.   oseltamivir 75 MG capsule Commonly known as:  TAMIFLU Take  1 capsule (75 mg total) by mouth 2 (two) times daily.           Objective:   Physical Exam BP 118/68 (BP Location: Left Arm, Patient Position: Sitting, Cuff Size: Normal)   Pulse (!) 108   Temp 99.8 F (37.7 C) (Oral)   Resp 16   Ht 5\' 10"  (1.778 m)   Wt 250 lb 6 oz (113.6 kg)   SpO2 96%   BMI 35.93 kg/m       General:   Well developed, some cough noted but he is nontoxic.  Not acutely ill. HEENT:  Normocephalic . Face symmetric, atraumatic. TMs: Not red or bulging.  Throat is slightly red, no discharge.  Nose is slightly congested, sinuses no TTP Lungs:  No crackles, very few rhonchi with cough.  No wheezing. Normal respiratory effort, no intercostal retractions, no accessory muscle use. Heart: RRR,  no murmur.  No pretibial edema bilaterally  Skin: Not pale. Not jaundice Neurologic:  alert & oriented X3.  Speech normal, gait appropriate for age and unassisted Psych--  Cognition and judgment appear intact.    Cooperative with normal attention span and concentration.  Behavior appropriate. No anxious or depressed appearing.   AssessmeSickle examnt      Assessment ADHD s/p eval  Dr. Reggy Eye 2016, Ritalin decreased appetite (as a child); Vyvanse trial 2016 cuased s/e   PLAN:  Viral syndrome: Flu test negative, rapid strep test negative. Still suspect influenza, PNM is in the  DDX. Plan: Tamiflu, chest x-ray, rest, cough control with hydrocodone, see AVS. Call if not gradually better He is contagious, has a 27-month-old baby at home, recommend to call pediatrician today for possible Tamiflu, precautions discussed Social: He is working at [employer], has a 27-month-old baby at home. RTC 6 months

## 2018-07-29 ENCOUNTER — Other Ambulatory Visit: Payer: Self-pay

## 2018-07-29 ENCOUNTER — Encounter (HOSPITAL_COMMUNITY): Payer: Self-pay | Admitting: *Deleted

## 2018-07-29 ENCOUNTER — Emergency Department (HOSPITAL_COMMUNITY)
Admission: EM | Admit: 2018-07-29 | Discharge: 2018-07-30 | Disposition: A | Discharge status: Home / Self Care | Payer: 59 | Attending: Emergency Medicine | Admitting: Emergency Medicine

## 2018-07-29 DIAGNOSIS — S82142A Displaced bicondylar fracture of left tibia, initial encounter for closed fracture: Secondary | ICD-10-CM | POA: Diagnosis not present

## 2018-07-29 DIAGNOSIS — Y9389 Activity, other specified: Secondary | ICD-10-CM | POA: Insufficient documentation

## 2018-07-29 DIAGNOSIS — M79642 Pain in left hand: Secondary | ICD-10-CM | POA: Diagnosis not present

## 2018-07-29 DIAGNOSIS — Y92415 Exit ramp or entrance ramp of street or highway as the place of occurrence of the external cause: Secondary | ICD-10-CM | POA: Diagnosis not present

## 2018-07-29 DIAGNOSIS — M25562 Pain in left knee: Secondary | ICD-10-CM | POA: Diagnosis not present

## 2018-07-29 DIAGNOSIS — Z79899 Other long term (current) drug therapy: Secondary | ICD-10-CM | POA: Diagnosis not present

## 2018-07-29 DIAGNOSIS — S8992XA Unspecified injury of left lower leg, initial encounter: Secondary | ICD-10-CM | POA: Diagnosis not present

## 2018-07-29 DIAGNOSIS — S6992XA Unspecified injury of left wrist, hand and finger(s), initial encounter: Secondary | ICD-10-CM | POA: Diagnosis present

## 2018-07-29 DIAGNOSIS — S6392XA Sprain of unspecified part of left wrist and hand, initial encounter: Secondary | ICD-10-CM | POA: Insufficient documentation

## 2018-07-29 DIAGNOSIS — Z23 Encounter for immunization: Secondary | ICD-10-CM | POA: Insufficient documentation

## 2018-07-29 DIAGNOSIS — Y999 Unspecified external cause status: Secondary | ICD-10-CM | POA: Diagnosis not present

## 2018-07-29 DIAGNOSIS — S82145A Nondisplaced bicondylar fracture of left tibia, initial encounter for closed fracture: Secondary | ICD-10-CM | POA: Diagnosis not present
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
## 2018-07-30 ENCOUNTER — Emergency Department (HOSPITAL_COMMUNITY): Payer: 59

## 2018-07-30 DIAGNOSIS — S8992XA Unspecified injury of left lower leg, initial encounter: Secondary | ICD-10-CM | POA: Diagnosis not present

## 2018-07-30 DIAGNOSIS — M25562 Pain in left knee: Secondary | ICD-10-CM | POA: Diagnosis not present

## 2018-07-30 DIAGNOSIS — M79642 Pain in left hand: Secondary | ICD-10-CM | POA: Diagnosis not present

## 2018-07-30 DIAGNOSIS — S6992XA Unspecified injury of left wrist, hand and finger(s), initial encounter: Secondary | ICD-10-CM | POA: Diagnosis not present

## 2018-07-30 MED ORDER — TETANUS-DIPHTH-ACELL PERTUSSIS 5-2.5-18.5 LF-MCG/0.5 IM SUSP
0.5000 mL | Freq: Once | INTRAMUSCULAR | Status: AC
  Administered 2018-07-30: 0.5 mL via INTRAMUSCULAR
  Filled 2018-07-30: qty 0.5
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
## 2018-08-07 DIAGNOSIS — M25562 Pain in left knee: Secondary | ICD-10-CM | POA: Diagnosis not present

## 2018-08-10 DIAGNOSIS — M25562 Pain in left knee: Secondary | ICD-10-CM | POA: Diagnosis not present

## 2018-08-14 DIAGNOSIS — S8392XA Sprain of unspecified site of left knee, initial encounter: Secondary | ICD-10-CM | POA: Diagnosis not present

## 2018-08-16 DIAGNOSIS — S8392XD Sprain of unspecified site of left knee, subsequent encounter: Secondary | ICD-10-CM | POA: Diagnosis not present

## 2019-10-08 ENCOUNTER — Telehealth (INDEPENDENT_AMBULATORY_CARE_PROVIDER_SITE_OTHER): Payer: BC Managed Care – PPO | Admitting: Internal Medicine

## 2019-10-08 ENCOUNTER — Encounter: Payer: Self-pay | Admitting: Internal Medicine

## 2019-10-08 VITALS — Ht 70.0 in | Wt 255.0 lb

## 2019-10-08 DIAGNOSIS — R05 Cough: Secondary | ICD-10-CM | POA: Diagnosis not present

## 2019-10-08 DIAGNOSIS — R059 Cough, unspecified: Secondary | ICD-10-CM

## 2019-10-08 MED ORDER — AZITHROMYCIN 250 MG PO TABS: ORAL_TABLET | ORAL | 0 refills | Status: DC

## 2019-10-08 MED ORDER — AZELASTINE HCL 0.1 % NA SOLN: 2.0000 | Freq: Every evening | NASAL | 3 refills | Status: DC | PRN

## 2019-10-08 NOTE — Progress Notes (Signed)
   Subjective:    Patient ID: Phillip Harris, male    DOB: 06-26-1986, 34 y.o.   MRN: [id number]  DOS:  10/08/2019 Type of visit - description: Virtual Visit via Video Note  I connected with the above patient  by a video enabled telemedicine application and verified that I am speaking with the correct person using two identifiers.   THIS ENCOUNTER IS A VIRTUAL VISIT DUE TO COVID-19 - PATIENT WAS NOT SEEN IN THE OFFICE. PATIENT HAS CONSENTED TO VIRTUAL VISIT / TELEMEDICINE VISIT   Location of patient: home  Location of provider: office  I discussed the limitations of evaluation and management by telemedicine and the availability of in person appointments. The patient expressed understanding and agreed to proceed.  Acute 2 months history of sinus and chest congestion. The sinus congestion typically is  worse at nighttime when he goes to bed, + runny nose. He also has cough, worse early in the morning associated with some green sputum and chest congestion. The rest of the day he also has cough but is dry.  No history of asthma, GERD, no tobacco abuse.   Review of Systems Denies fever chills No nausea or vomiting No itchy eyes or nose   Past Medical History:  Diagnosis Date  . ADHD (attention deficit hyperactivity disorder)    Dr. Reggy Eye    Past Surgical History:  Procedure Laterality Date  . EYE SURGERY Bilateral 2012   Laser Eye Surgery   . FRACTURE SURGERY    . Left forearm Surgery  2004  . REFRACTIVE SURGERY  2012  . Right Wrist Surgery  2009    Allergies as of 10/08/2019   No Known Allergies     Medication List       Accurate as of October 08, 2019  4:04 PM. If you have any questions, ask your nurse or doctor.        HYDROcodone-homatropine 5-1.5 MG/5ML syrup Commonly known as: HYCODAN Take 5 mLs by mouth 2 (two) times daily as needed for cough.   oseltamivir 75 MG capsule Commonly known as: Tamiflu Take 1 capsule (75 mg total) by mouth 2 (two) times  daily.          Objective:   Physical Exam Ht 5\' 10"  (1.778 m)   Wt 255 lb (115.7 kg)   BMI 36.59 kg/m  This is a virtual video visit, he is alert oriented x3, in no distress.  He did cough 1 or 2 times during the visit, I heard mild chest congestion but no wheezing.    Assessment     Assessment ADHD s/p eval  Dr. 2016, Ritalin decreased appetite (as a child); Vyvanse trial 2016 cuased s/e   PLAN:  Cough: Upper respiratory symptoms for 2 months, although I believe this is probably allergies the symptoms started before pollen season, also he is coughing up a small amount of greenish sputum production.  Atypical bronchial infection?. Plan: Flonase, Astelin, Zithromax, Claritin.  Continue Mucinex, call if not gradually better.  Message sent with instructions.    I discussed the assessment and treatment plan with the patient. The patient was provided an opportunity to ask questions and all were answered. The patient agreed with the plan and demonstrated an understanding of the instructions.   The patient was advised to call back or seek an in-person evaluation if the symptoms worsen or if the condition fails to improve as anticipated.

## 2019-10-08 NOTE — Progress Notes (Signed)
Pre visit review using our clinic review tool, if applicable. No additional management support is needed unless otherwise documented below in the visit note. 

## 2019-10-10 NOTE — Assessment & Plan Note (Signed)
Cough: Upper respiratory symptoms for 2 months, although I believe this is probably allergies the symptoms started before pollen season, also he is coughing up a small amount of greenish sputum production.  Atypical bronchial infection?. Plan: Flonase, Astelin, Zithromax, Claritin.  Continue Mucinex, call if not gradually better.  Message sent with instructions.

## 2019-11-25 ENCOUNTER — Telehealth: Payer: Self-pay

## 2019-11-25 NOTE — Telephone Encounter (Signed)
Nurse Assessment Nurse: Gasper Sells, RN, Marylu Lund Date/Time Phillip Harris Time): 11/24/2019 11:26:28 AM Confirm and document reason for call. If symptomatic, describe symptoms. ---Caller states about a month ago was boxing dull aching pain on the underside of his right wrist that hurts when applying pressure like a push up position. Has the patient had close contact with a person known or suspected to have the novel coronavirus illness OR traveled / lives in area with major community spread (including international travel) in the last 14 days from the onset of symptoms? * If Asymptomatic, screen for exposure and travel within the last 14 days. ---No Does the patient have any new or worsening symptoms? ---No Please document clinical information provided and list any resource used. ---appt Friday 2:20pm. Guidelines Guideline Title Affirmed Question Affirmed Notes Nurse Date/Time Phillip Harris Time) Hand and Wrist Injury [1] After 2 weeks AND [2] still painful [name] 11/24/2019 11:28:55 AM Disp. Time Phillip Harris Time) Disposition Final User 11/24/2019 11:10:09 AM Attempt made - message left [name] 11/24/2019 11:30:11 AM SEE PCP WITHIN 3 DAYS Yes Gasper Sells, RN, Marylu Lund

## 2019-11-25 NOTE — Telephone Encounter (Signed)
Phillip Harris- Pt needs appt in person please. Can you try contacting him to schedule?

## 2019-11-25 NOTE — Telephone Encounter (Signed)
Appt scheduled 11/28/19.

## 2019-11-28 ENCOUNTER — Encounter: Payer: Self-pay | Admitting: Internal Medicine

## 2019-11-28 ENCOUNTER — Other Ambulatory Visit: Payer: Self-pay

## 2019-11-28 ENCOUNTER — Ambulatory Visit (HOSPITAL_BASED_OUTPATIENT_CLINIC_OR_DEPARTMENT_OTHER)
Admission: RE | Admit: 2019-11-28 | Discharge: 2019-11-28 | Disposition: A | Discharge status: Home / Self Care | Payer: BC Managed Care – PPO | Source: Ambulatory Visit | Attending: Internal Medicine | Admitting: Internal Medicine

## 2019-11-28 ENCOUNTER — Ambulatory Visit: Payer: BC Managed Care – PPO | Admitting: Internal Medicine

## 2019-11-28 VITALS — BP 122/79 | HR 75 | Temp 97.0°F | Resp 16 | Ht 70.0 in | Wt 248.2 lb

## 2019-11-28 DIAGNOSIS — S6991XA Unspecified injury of right wrist, hand and finger(s), initial encounter: Secondary | ICD-10-CM | POA: Insufficient documentation

## 2019-11-28 NOTE — Progress Notes (Signed)
Pre visit review using our clinic review tool, if applicable. No additional management support is needed unless otherwise documented below in the visit note. 

## 2019-11-28 NOTE — Patient Instructions (Addendum)
Please schedule a physical exam at your earliest convenience.   COVID-19 Vaccine Information can be found at: [email or web address] For questions related to vaccine distribution or appointments, please email [email or web address] or call [phone].   Please proceed to the x-ray department downstairs  IBUPROFEN (Advil or Motrin) 200 mg 2 tablets every 12 hours as needed for pain.  Always take it with food because may cause gastritis and ulcers.  If you notice nausea, stomach pain, change in the color of stools --->  Stop the medicine and let us know  We are referring you  to the orthopedic surgeon

## 2019-11-28 NOTE — Progress Notes (Signed)
   Subjective:    Patient ID: Phillip Harris, male    DOB: Aug 05, 1985, 34 y.o.   MRN: [id number]  DOS:  11/28/2019 Type of visit - description: Acute 6 weeks ago he was boxing at the gym hitting the heavybag, suddenly he developed pain at the right wrist. Since then, is using Tylenol, ice and a brace and the pain is no better. Pain is worse with Ulnar deviation.   Review of Systems See above   Past Medical History:  Diagnosis Date  . ADHD (attention deficit hyperactivity disorder)    Dr. Reggy Eye    Past Surgical History:  Procedure Laterality Date  . EYE SURGERY Bilateral 2012   Laser Eye Surgery   . FRACTURE SURGERY    . Left forearm Surgery  2004  . REFRACTIVE SURGERY  2012  . Right Wrist Surgery  2009    Allergies as of 11/28/2019   No Known Allergies     Medication List       Accurate as of November 28, 2019  2:17 PM. If you have any questions, ask your nurse or doctor.        azelastine 0.1 % nasal spray Commonly known as: ASTELIN Place 2 sprays into both nostrils at bedtime as needed for rhinitis. Use in each nostril as directed   azithromycin 250 MG tablet Commonly known as: Zithromax Z-Pak 2 tabs a day the first day, then 1 tab a day x 4 days          Objective:   Physical Exam BP 122/79 (BP Location: Left Arm, Patient Position: Sitting, Cuff Size: Normal)   Pulse 75   Temp (!) 97 F (36.1 C) (Temporal)   Resp 16   Ht 5\' 10"  (1.778 m)   Wt 248 lb 4 oz (112.6 kg)   SpO2 99%   BMI 35.62 kg/m  General:   Well developed, NAD, BMI noted. HEENT:  Normocephalic . Face symmetric, atraumatic  MSK: Left wrist normal Right wrist: Normal to inspection, palpation. No deformities, minimal TTP at the ulnar side of the wrist without swelling. Skin: Not pale. Not jaundice Neurologic:  alert & oriented X3.  Speech normal, gait appropriate for age and unassisted Psych--  Cognition and judgment appear intact.  Cooperative with normal attention span and  concentration.  Behavior appropriate. No anxious or depressed appearing.      Assessment     Assessment ADHD s/p eval  Dr. 2016, Ritalin decreased appetite (as a child); Vyvanse trial 2016 cuased s/e   PLAN:  Right wrist injury: As described above, pain going on for 6 weeks, not getting better. We will get an x-ray and refer to the orthopedic surgeon. Switch Tylenol to ibuprofen, GI precautions discussed. Preventive care: Recommend a Covid vaccination.  This visit occurred during the SARS-CoV-2 public health emergency.  Safety protocols were in place, including screening questions prior to the visit, additional usage of staff PPE, and extensive cleaning of exam room while observing appropriate contact time as indicated for disinfecting solutions.

## 2019-11-30 ENCOUNTER — Encounter: Payer: Self-pay | Admitting: Internal Medicine

## 2019-11-30 NOTE — Assessment & Plan Note (Signed)
Right wrist injury: As described above, pain going on for 6 weeks, not getting better. We will get an x-ray and refer to the orthopedic surgeon. Switch Tylenol to ibuprofen, GI precautions discussed. Preventive care: Recommend a Covid vaccination.

## 2019-12-15 ENCOUNTER — Ambulatory Visit: Payer: Self-pay

## 2019-12-15 ENCOUNTER — Ambulatory Visit (INDEPENDENT_AMBULATORY_CARE_PROVIDER_SITE_OTHER): Payer: BC Managed Care – PPO | Admitting: Family Medicine

## 2019-12-15 ENCOUNTER — Encounter: Payer: Self-pay | Admitting: Family Medicine

## 2019-12-15 ENCOUNTER — Other Ambulatory Visit: Payer: Self-pay

## 2019-12-15 DIAGNOSIS — M25531 Pain in right wrist: Secondary | ICD-10-CM | POA: Diagnosis not present

## 2019-12-15 MED ORDER — DICLOFENAC SODIUM 1 % EX GEL: 4.0000 g | Freq: Four times a day (QID) | CUTANEOUS | 6 refills | Status: DC | PRN

## 2019-12-15 NOTE — Patient Instructions (Signed)
   Right wrist extensor carpi ulnaris tendon subluxation (ECU tendon).

## 2019-12-15 NOTE — Progress Notes (Signed)
Office Visit Note   Patient: Phillip Harris           Date of Birth: 1986-02-26           MRN: [id number] Visit Date: 12/15/2019 Requested by: Colon Branch, [address] [place],  [place] [address] PCP: Colon Branch, MD  Subjective: Chief Complaint  Patient presents with  . Right Wrist - Pain    Pain ulnar aspect of wrist x 2 months. Pain after hitting a punching bag at a weird angle - boxes for exercise. Hurts with certain turning motions of the wrist. Right hand dominant.    HPI: He is here with right wrist pain.  He is right-hand dominant.  About 2 months ago he was hitting a punching bag, his wrist bent and he felt severe pain on the ulnar side.  He had to stop his exercise at that point.  He has continued to have pain despite rest.  Denies numbness or tingling.  He is taking anti-inflammatories with no relief.  He recently saw his PCP who ordered x-rays which were unremarkable.  He now presents for further evaluation.  No previous problems with his wrist.  For his work he is on a computer most of the time.               ROS:   All other systems were reviewed and are negative.  Objective: Vital Signs: There were no vitals taken for this visit.  Physical Exam:  General:  Alert and oriented, in no acute distress. Pulm:  Breathing unlabored. Psy:  Normal mood, congruent affect. Skin: No bruising Right wrist: He has full range of motion of the wrist, full pronation and supination of the forearm.  He has pain with pronation and supination and he is tender to palpation near the ECU tendon and near the TFC.  Imaging: US Guided Needle Placement  Result Date: 12/15/2019 Limited diagnostic ultrasound of the right wrist focused on the ulnar aspect reveals subluxation of the ECU tendon out of its groove at the extreme of supination of the forearm.  I do not see a split tear of the tendon.  Unable to fully visualize the TFC.   Assessment & Plan: 1.  Right wrist  pain, suspect due to subluxing ECU tendon.  Cannot rule out TFC tear. -We will refer him to PT and hand for a custom splint to immobilize the ECU tendon.  I will see him back in 4 to 6 weeks for recheck and probably for repeat dynamic ultrasound imaging.  If still symptomatic, then we will order MRI arthrogram of the wrist to look for TFC tear and possibly consider hand surgery consult.     Procedures: No procedures performed  No notes on file     PMFS History: Patient Active Problem List   Diagnosis Date Noted  . PCP NOTES >>>> 04/20/2015  . Anal fissure 12/17/2014  . Anal skin tag 12/17/2014  . Attention deficit hyperactivity disorder 06/17/2014  . Internal hemorrhoids 04/27/2014  . Epididymitis, left 08/23/2011  . SCROTAL MASS 08/03/2009   Past Medical History:  Diagnosis Date  . ADHD (attention deficit hyperactivity disorder)    Dr. Lurline Hare    Family History  Problem Relation Age of Onset  . Diabetes Mother   . Gastric cancer Mother        gastroesophageal junction cancer  . Hypertension Mother   . Hyperlipidemia Mother   . Hypothyroidism Mother   .  Diabetes Father   . Hyperlipidemia Father   . Hypertension Father     Past Surgical History:  Procedure Laterality Date  . EYE SURGERY Bilateral 2012   Laser Eye Surgery   . FRACTURE SURGERY    . Left forearm Surgery  2004  . REFRACTIVE SURGERY  2012  . Right Wrist Surgery  2009   Social History   Occupational History  . Occupation: Technical sales engineer, Technical sales engineer  Tobacco Use  . Smoking status: Never Smoker  . Smokeless tobacco: Never Used  Substance and Sexual Activity  . Alcohol use: Yes    Alcohol/week: 0.0 standard drinks    Comment: occasionally  . Drug use: No  . Sexual activity: Not on file

## 2019-12-16 ENCOUNTER — Telehealth: Payer: Self-pay

## 2019-12-16 NOTE — Telephone Encounter (Signed)
Phillip Harris with [facility] would like a referral for PT faxed to [phone] attn.Phillip Harris.  Cb# [phone].  Please advise.  Thank you.

## 2019-12-16 NOTE — Telephone Encounter (Signed)
FAXED

## 2019-12-16 NOTE — Telephone Encounter (Signed)
Would you mind faxing this referral? The patient was given the hard copy, but it looks like they need a fax.

## 2020-01-26 ENCOUNTER — Ambulatory Visit: Payer: BC Managed Care – PPO | Admitting: Family Medicine

## 2020-09-02 ENCOUNTER — Encounter: Payer: Self-pay | Admitting: Internal Medicine

## 2020-10-12 IMAGING — DX DG WRIST COMPLETE 3+V*R*
4 series · 4 of 4 positions shown · non-contrast
Comparison: None.

CLINICAL DATA: Right wrist pain after injury.

EXAM:
RIGHT WRIST - COMPLETE 3+ VIEW

[wrist pa]
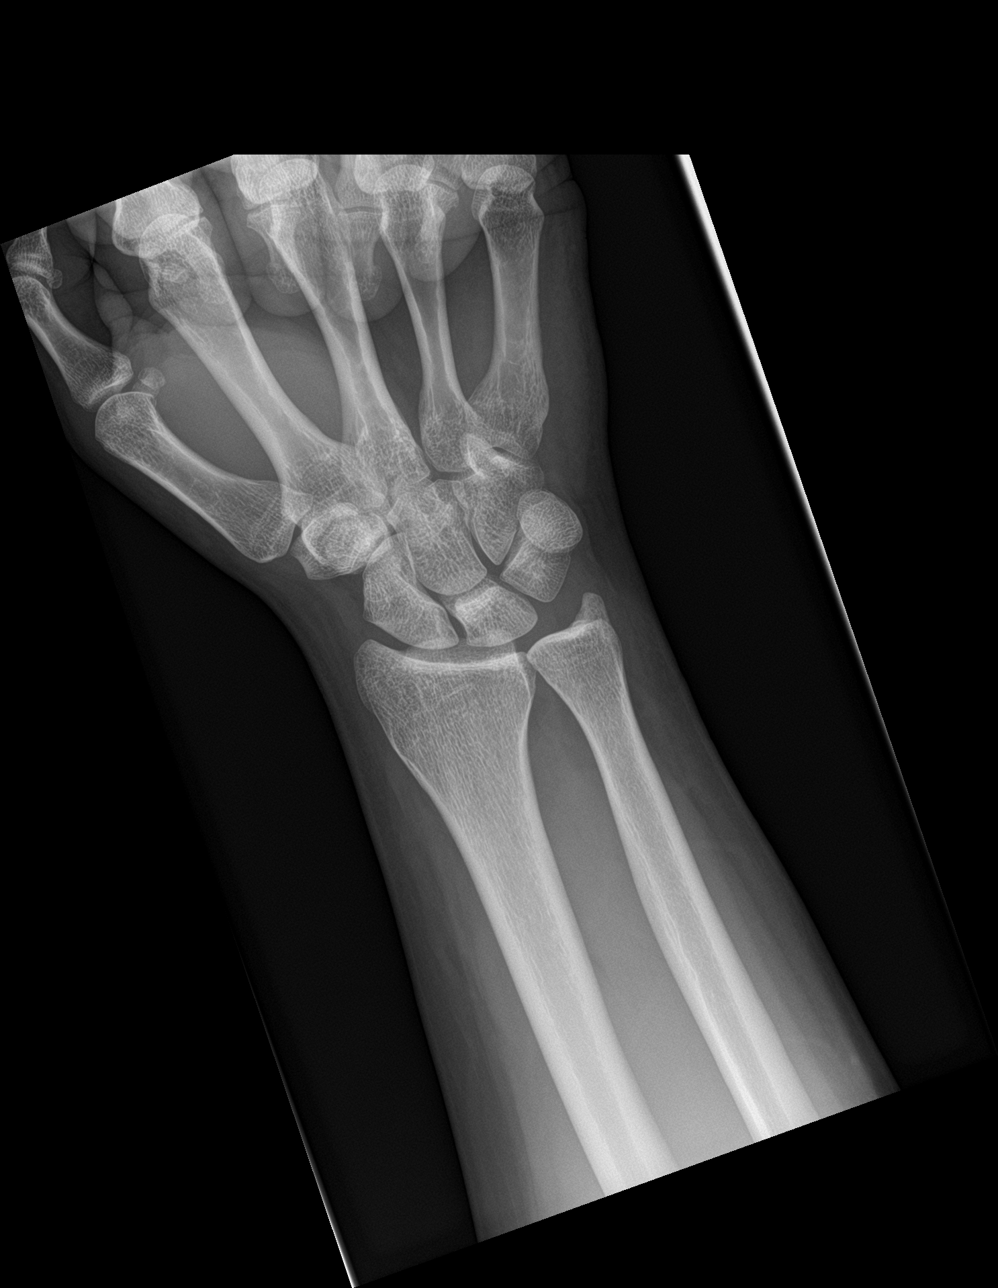

[wrist obl]
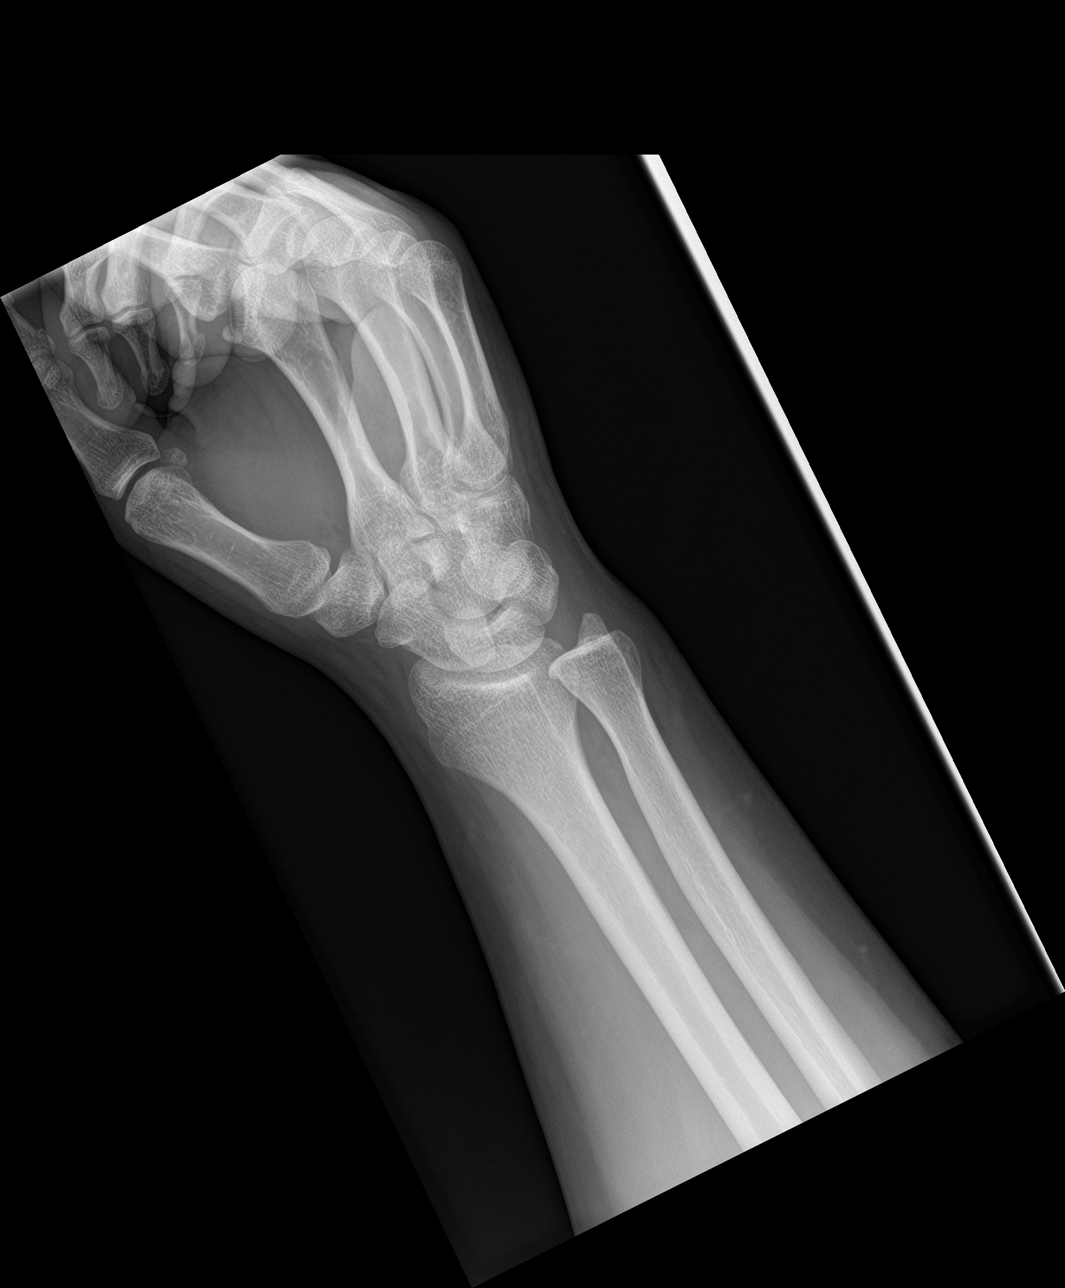

[wrist lat]
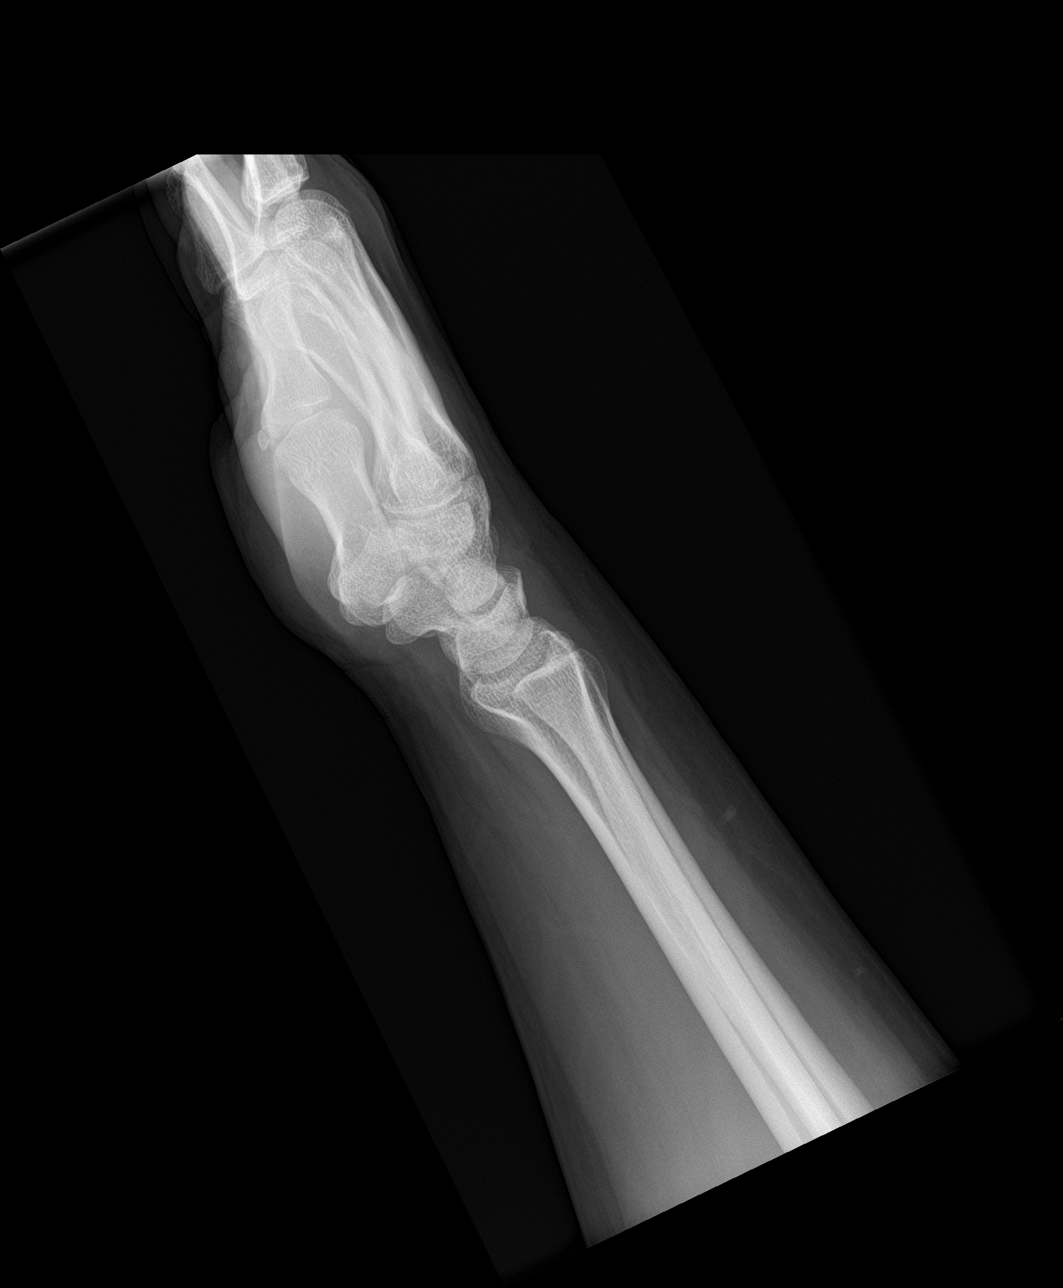

[wrist navicular]
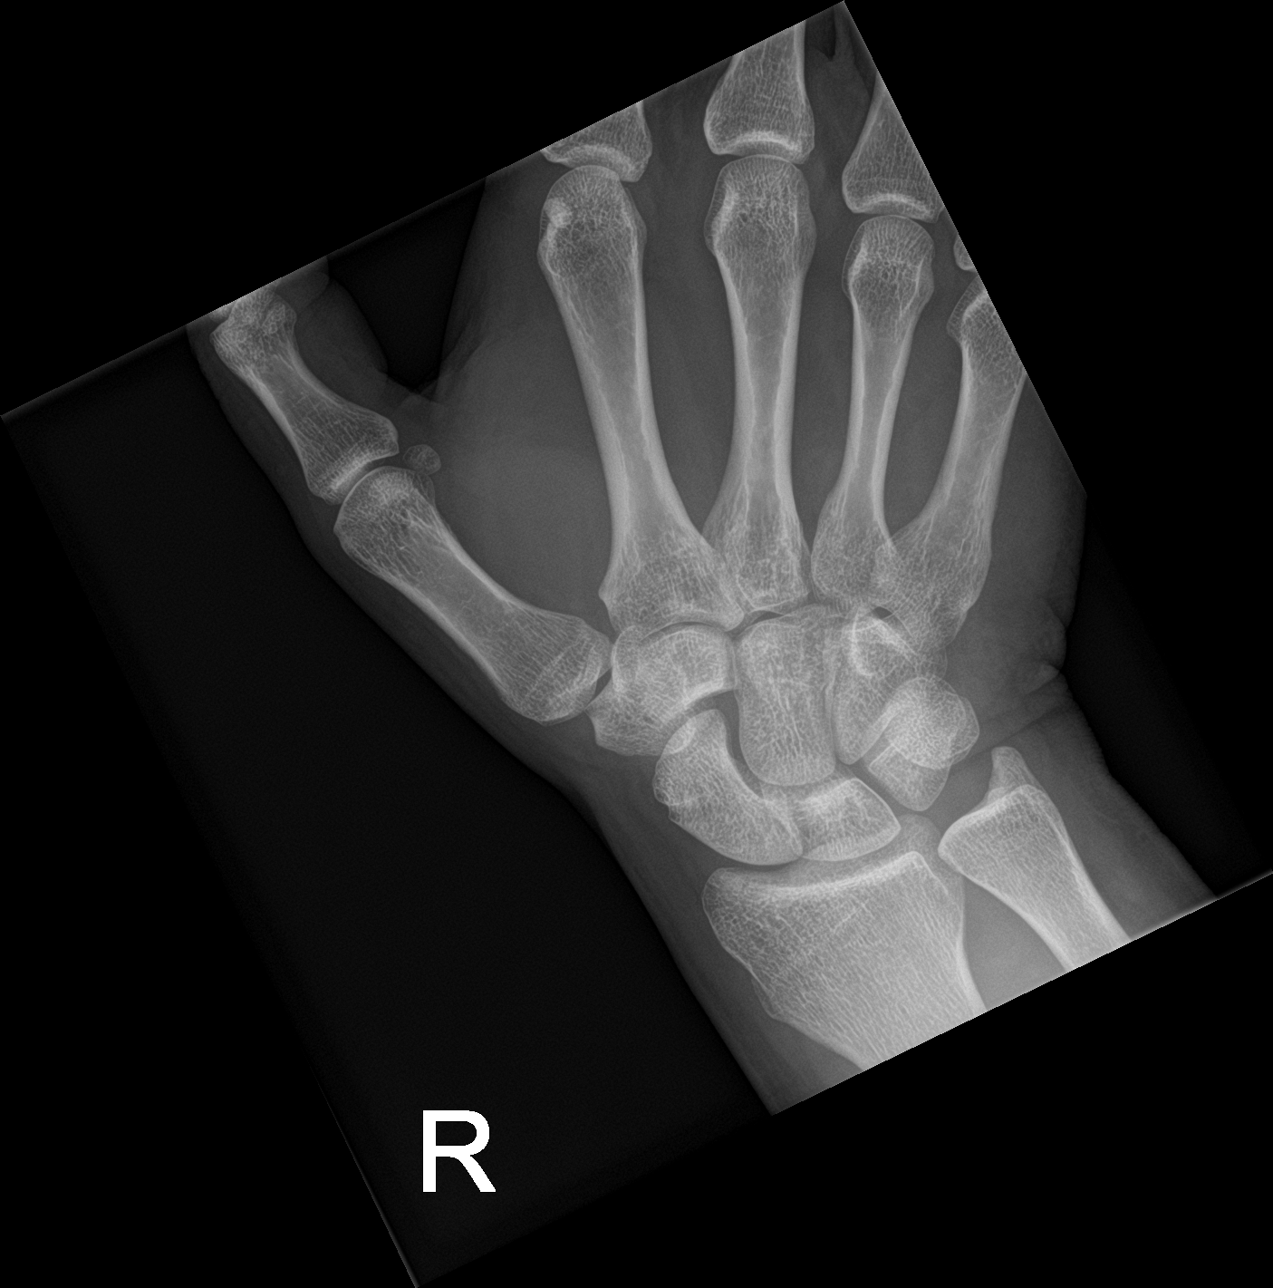

[4 of 4 positions shown; findings below may reference images not displayed]

FINDINGS: There is no evidence of fracture or dislocation. Scaphoid is intact.
There is no evidence of arthropathy or other focal bone abnormality.
Soft tissues are unremarkable.
IMPRESSION: Negative radiographs of the right wrist.

## 2021-06-16 ENCOUNTER — Encounter: Payer: Self-pay | Admitting: Internal Medicine

## 2021-06-16 ENCOUNTER — Ambulatory Visit (INDEPENDENT_AMBULATORY_CARE_PROVIDER_SITE_OTHER): Payer: Managed Care, Other (non HMO) | Admitting: Internal Medicine

## 2021-06-16 ENCOUNTER — Telehealth: Payer: Self-pay | Admitting: Internal Medicine

## 2021-06-16 VITALS — BP 116/84 | HR 67 | Temp 97.7°F | Resp 16 | Ht 70.0 in | Wt 235.0 lb

## 2021-06-16 DIAGNOSIS — F909 Attention-deficit hyperactivity disorder, unspecified type: Secondary | ICD-10-CM

## 2021-06-16 DIAGNOSIS — Z79899 Other long term (current) drug therapy: Secondary | ICD-10-CM | POA: Diagnosis not present

## 2021-06-16 MED ORDER — AMPHETAMINE-DEXTROAMPHET ER 30 MG PO CP24: 30.0000 mg | ORAL_CAPSULE | Freq: Every day | ORAL | 0 refills | Status: DC | PRN

## 2021-06-16 NOTE — Telephone Encounter (Signed)
Patient states his pharmacy said his insurance needs prior authorization for his adderall prescription. Please advice    amphetamine-dextroamphetamine (ADDERALL XR) 30 MG 24 hr capsule

## 2021-06-16 NOTE — Patient Instructions (Addendum)
I sent your prescription, please call when you need a refill   GO TO THE LAB : Provide a urine sample   GO TO THE FRONT DESK, PLEASE SCHEDULE YOUR APPOINTMENTS Come back for a physical exam in about 3 months

## 2021-06-16 NOTE — Progress Notes (Signed)
° °  Subjective:    Patient ID: Phillip Harris, male    DOB: Jun 02, 1986, 35 y.o.   MRN: [id number]  DOS:  06/16/2021 Type of visit - description: Follow-up  In the last few months there has been some changes in his life, he is more busy and ADD symptoms have resurfaced with difficulty focusing on the many tasks he has.  He changed his work schedule and he works 2 weeks day shift and 2 weeks night shift.  Consequently sleep pattern has changed. Is doing some extra training at work.  Also few months ago lost his mother, he is presently doing well emotionally but his father is having a hard time and Thayer Ohm is providing all the support he needs   Review of Systems Denies chest pain or difficulty breathing.  No edema or palpitations. No headache, dizziness, diplopia  Past Medical History:  Diagnosis Date   ADHD (attention deficit hyperactivity disorder)    Dr. Reggy Eye    Past Surgical History:  Procedure Laterality Date   EYE SURGERY Bilateral 2012   Laser Eye Surgery    FRACTURE SURGERY     Left forearm Surgery  2004   REFRACTIVE SURGERY  2012   Right Wrist Surgery  2009    Allergies as of 06/16/2021   No Known Allergies      Medication List        Accurate as of June 16, 2021 11:21 AM. If you have any questions, ask your nurse or doctor.          diclofenac Sodium 1 % Gel Commonly known as: Voltaren Apply 4 g topically 4 (four) times daily as needed.           Objective:   Physical Exam BP 116/84 (BP Location: Left Arm, Patient Position: Sitting, Cuff Size: Normal)    Pulse 67    Temp 97.7 F (36.5 C) (Oral)    Resp 16    Ht 5\' 10"  (1.778 m)    Wt 235 lb (106.6 kg)    SpO2 97%    BMI 33.72 kg/m  General:   Well developed, NAD, BMI noted. HEENT:  Normocephalic . Face symmetric, atraumatic Lungs:  CTA B Normal respiratory effort, no intercostal retractions, no accessory muscle use. Heart: RRR,  no murmur.  Lower extremities: no pretibial edema  bilaterally  Skin: Not pale. Not jaundice Neurologic:  alert & oriented X3.  Speech normal, gait appropriate for age and unassisted Psych--  Cognition and judgment appear intact.  Cooperative with normal attention span and concentration.  Behavior appropriate. No anxious or depressed appearing.      Assessment    Assessment ADHD s/p eval  Dr. 2016, Ritalin decreased appetite (as a child); Vyvanse trial 2016 cuased s/e   PLAN:  ADHD: Symptoms have resurfaced in the last few months, I believe the triggers are the  change in sleep pattern (schedule at work has changed) and also the amount of work and family duties he has. No depression or anxiety per se. Plan: UDS Restart Adderall XL 30 mg daily (change to plain Adderall if difficulty sleeping). Patient in agreement, call for RFs RTC 3 months CPX   This visit occurred during the SARS-CoV-2 public health emergency.  Safety protocols were in place, including screening questions prior to the visit, additional usage of staff PPE, and extensive cleaning of exam room while observing appropriate contact time as indicated for disinfecting solutions.

## 2021-06-17 NOTE — Telephone Encounter (Signed)
Spoke w/ [facility] in [place]- made aware of PA approval.

## 2021-06-17 NOTE — Assessment & Plan Note (Signed)
ADHD: Symptoms have resurfaced in the last few months, I believe the triggers are the  change in sleep pattern (schedule at work has changed) and also the amount of work and family duties he has. No depression or anxiety per se. Plan: UDS Restart Adderall XL 30 mg daily (change to plain Adderall if difficulty sleeping). Patient in agreement, call for RFs RTC 3 months CPX

## 2021-06-17 NOTE — Telephone Encounter (Signed)
PA initiated via Covermymeds; KEY: [id number]. PA approved, this request is approved from 06/17/2021 to 06/16/2024.

## 2021-06-21 LAB — DRUG MONITORING PANEL 375977 , URINE
Alcohol Metabolites: POSITIVE ng/mL — AB (ref <500)
Amphetamines: NEGATIVE ng/mL (ref <500)
Barbiturates: NEGATIVE ng/mL (ref <300)
Benzodiazepines: NEGATIVE ng/mL (ref <100)
Cocaine Metabolite: NEGATIVE ng/mL (ref <150)
Desmethyltramadol: NEGATIVE ng/mL (ref <100)
Ethyl Glucuronide (ETG): >10000 ng/mL — ABNORMAL HIGH (ref <500)
Ethyl Sulfate (ETS): 5449 ng/mL — ABNORMAL HIGH (ref <100)
Marijuana Metabolite: NEGATIVE ng/mL (ref <20)
Opiates: NEGATIVE ng/mL (ref <100)
Oxycodone: NEGATIVE ng/mL (ref <100)
Tramadol: NEGATIVE ng/mL (ref <100)

## 2021-06-21 LAB — DM TEMPLATE

## 2021-07-26 ENCOUNTER — Encounter: Payer: Self-pay | Admitting: Internal Medicine

## 2021-07-27 ENCOUNTER — Other Ambulatory Visit: Payer: Self-pay | Admitting: Internal Medicine

## 2021-07-27 ENCOUNTER — Telehealth: Payer: Self-pay

## 2021-07-27 MED ORDER — AMPHETAMINE-DEXTROAMPHETAMINE 20 MG PO TABS: 20.0000 mg | ORAL_TABLET | Freq: Every day | ORAL | 0 refills | Status: DC

## 2021-07-27 NOTE — Telephone Encounter (Signed)
PA approved. Effective 07/27/2021 to 07/26/2024.

## 2021-07-27 NOTE — Telephone Encounter (Signed)
PA initiated via Covermymeds; KEY: [id number]                        Awaiting determination.

## 2021-08-24 ENCOUNTER — Encounter: Payer: Managed Care, Other (non HMO) | Admitting: Internal Medicine

## 2021-09-07 ENCOUNTER — Encounter: Payer: Managed Care, Other (non HMO) | Admitting: Internal Medicine

## 2021-09-21 ENCOUNTER — Ambulatory Visit (INDEPENDENT_AMBULATORY_CARE_PROVIDER_SITE_OTHER): Payer: Managed Care, Other (non HMO) | Admitting: Internal Medicine

## 2021-09-21 ENCOUNTER — Encounter: Payer: Self-pay | Admitting: Internal Medicine

## 2021-09-21 VITALS — BP 124/80 | HR 59 | Temp 97.8°F | Resp 16 | Ht 70.0 in | Wt 233.1 lb

## 2021-09-21 DIAGNOSIS — Z0000 Encounter for general adult medical examination without abnormal findings: Secondary | ICD-10-CM | POA: Diagnosis not present

## 2021-09-21 NOTE — Progress Notes (Signed)
? ?Subjective:  ? ? Patient ID: Phillip Harris, male    DOB: 12/09/1985, 36 y.o.   MRN: [id number] ? ?DOS:  09/21/2021 ?Type of visit - description: cpx ? ?Here for CPX. ?Since the last office visit is doing well. ?He self discontinued Adderall due to side effects. ? ?Review of Systems ? ?Other than above, a 14 point review of systems is negative  ?  ? ?Past Medical History:  ?Diagnosis Date  ? ADHD (attention deficit hyperactivity disorder)   ? Dr. Reggy Eye  ? ? ?Past Surgical History:  ?Procedure Laterality Date  ? EYE SURGERY Bilateral 11-01-2010  ? Laser Eye Surgery   ? FRACTURE SURGERY    ? Left forearm Surgery  2002-11-01  ? REFRACTIVE SURGERY  Nov 01, 2010  ? Right Wrist Surgery  2007/11/01  ? ?Social History  ? ?Socioeconomic History  ? Marital status: Married  ?  Spouse name: Not on file  ? Number of children: 1  ? Years of education: Not on file  ? Highest education level: Not on file  ?Occupational History  ? Occupation: Technical sales engineer, Technical sales engineer  ?Tobacco Use  ? Smoking status: Never  ? Smokeless tobacco: Never  ?Substance and Sexual Activity  ? Alcohol use: Yes  ?  Alcohol/week: 0.0 standard drinks  ?  Comment: occasionally  ? Drug use: No  ? Sexual activity: Not on file  ?Other Topics Concern  ? Not on file  ?Social History Narrative  ?  Lives with wife, Tresa Endo  ? 1 child born Oct 31, 2017, boy   ? Mother, Park Meo, passed away Oct 31, 2020  ? ?Social Determinants of Health  ? ?Financial Resource Strain: Not on file  ?Food Insecurity: Not on file  ?Transportation Needs: Not on file  ?Physical Activity: Not on file  ?Stress: Not on file  ?Social Connections: Not on file  ?Intimate Partner Violence: Not on file  ? ? ?Current Outpatient Medications  ?Medication Instructions  ? diclofenac Sodium (VOLTAREN) 4 g, Topical, 4 times daily PRN  ? ? ?   ?Objective:  ? Physical Exam ?BP 124/80 (BP Location: Left Arm, Patient Position: Sitting, Cuff Size: Small)   Pulse (!) 59   Temp 97.8 ?F (36.6 ?C) (Oral)   Resp 16   Ht 5\' 10"  (1.778 m)   Wt  233 lb 2 oz (105.7 kg)   SpO2 98%   BMI 33.45 kg/m?  ?General: ?Well developed, NAD, BMI noted ?Neck: No  thyromegaly  ?HEENT:  ?Normocephalic . Face symmetric, atraumatic ?Lungs:  ?CTA B ?Normal respiratory effort, no intercostal retractions, no accessory muscle use. ?Heart: RRR,  no murmur.  ?Abdomen:  ?Not distended, soft, non-tender. No rebound or rigidity.   ?Lower extremities: no pretibial edema bilaterally  ?Skin: Exposed areas without rash. Not pale. Not jaundice ?Neurologic:  ?alert & oriented X3.  ?Speech normal, gait appropriate for age and unassisted ?Strength symmetric and appropriate for age.  ?Psych: ?Cognition and judgment appear intact.  ?Cooperative with normal attention span and concentration.  ?Behavior appropriate. ?No anxious or depressed appearing. ? ?   ?Assessment   ? ? Assessment ?ADHD s/p eval  Dr. 11-01-2014, Ritalin decreased appetite (as a child); Vyvanse trial Nov 01, 2014 cuased s/e.  Adderall: Dry mouth, dry eyes.    ? ?PLAN:  ?Here for CPX ?ADHD: Self DC Adderall due to dry mouth, dry eyes.  For now he is doing okay, planning to refer to ADHD specialist if he needs further treatment due to multiple intolerances ?RTC 1 year ? ? ? ?  This visit occurred during the SARS-CoV-2 public health emergency.  Safety protocols were in place, including screening questions prior to the visit, additional usage of staff PPE, and extensive cleaning of exam room while observing appropriate contact time as indicated for disinfecting solutions.  ? ?

## 2021-09-21 NOTE — Patient Instructions (Signed)
   GO TO THE LAB : Get the blood work     GO TO THE FRONT DESK, PLEASE SCHEDULE YOUR APPOINTMENTS Come back for a physical exam in 1 year 

## 2021-09-22 ENCOUNTER — Encounter: Payer: Self-pay | Admitting: Internal Medicine

## 2021-09-22 LAB — COMPREHENSIVE METABOLIC PANEL
ALT: 22 U/L (ref 0–53)
AST: 18 U/L (ref 0–37)
Albumin: 4.8 g/dL (ref 3.5–5.2)
Alkaline Phosphatase: 64 U/L (ref 39–117)
BUN: 11 mg/dL (ref 6–23)
CO2: 30 mEq/L (ref 19–32)
Calcium: 10.1 mg/dL (ref 8.4–10.5)
Chloride: 102 mEq/L (ref 96–112)
Creatinine, Ser: 0.99 mg/dL (ref 0.40–1.50)
GFR: 99.3 mL/min (ref >60.00)
Glucose, Bld: 86 mg/dL (ref 70–99)
Potassium: 4.4 mEq/L (ref 3.5–5.1)
Sodium: 140 mEq/L (ref 135–145)
Total Bilirubin: 0.7 mg/dL (ref 0.2–1.2)
Total Protein: 7.4 g/dL (ref 6.0–8.3)

## 2021-09-22 LAB — CBC WITH DIFFERENTIAL/PLATELET
Basophils Absolute: 0.1 10*3/uL (ref 0.0–0.1)
Basophils Relative: 1.1 % (ref 0.0–3.0)
Eosinophils Absolute: 0.3 10*3/uL (ref 0.0–0.7)
Eosinophils Relative: 4.4 % (ref 0.0–5.0)
HCT: 41.4 % (ref 39.0–52.0)
Hemoglobin: 13.8 g/dL (ref 13.0–17.0)
Lymphocytes Relative: 32.8 % (ref 12.0–46.0)
Lymphs Abs: 2.1 10*3/uL (ref 0.7–4.0)
MCHC: 33.4 g/dL (ref 30.0–36.0)
MCV: 85.8 fl (ref 78.0–100.0)
Monocytes Absolute: 0.5 10*3/uL (ref 0.1–1.0)
Monocytes Relative: 8 % (ref 3.0–12.0)
Neutro Abs: 3.5 10*3/uL (ref 1.4–7.7)
Neutrophils Relative %: 53.7 % (ref 43.0–77.0)
Platelets: 217 10*3/uL (ref 150.0–400.0)
RBC: 4.82 Mil/uL (ref 4.22–5.81)
RDW: 13.5 % (ref 11.5–15.5)
WBC: 6.5 10*3/uL (ref 4.0–10.5)

## 2021-09-22 LAB — TSH: TSH: 0.73 u[IU]/mL (ref 0.35–5.50)

## 2021-09-22 LAB — LIPID PANEL
Cholesterol: 224 mg/dL — ABNORMAL HIGH (ref 0–200)
HDL: 43.2 mg/dL (ref >39.00)
NonHDL: 180.45
Total CHOL/HDL Ratio: 5
Triglycerides: 247 mg/dL — ABNORMAL HIGH (ref 0.0–149.0)
VLDL: 49.4 mg/dL — ABNORMAL HIGH (ref 0.0–40.0)

## 2021-09-22 LAB — LDL CHOLESTEROL, DIRECT: Direct LDL: 166 mg/dL

## 2021-09-22 NOTE — Assessment & Plan Note (Signed)
Here for CPX ?ADHD: Self DC Adderall due to dry mouth, dry eyes.  For now he is doing okay, planning to refer to ADHD specialist if he needs further treatment due to multiple intolerances ?RTC 1 year ?

## 2021-09-22 NOTE — Assessment & Plan Note (Signed)
Tdap 2020 ?COVID VAX: Booster discussed with patient ?Labs: CMP, FLP, CBC, TSH ?Exercise: He is very active. ?Diet, room for improvement, pointers provided. ?Practices self testicular exams regularly.  Normal. ? ?  ?

## 2022-01-22 ENCOUNTER — Encounter: Payer: Self-pay | Admitting: Internal Medicine

## 2022-05-29 ENCOUNTER — Encounter: Payer: Self-pay | Admitting: Nurse Practitioner

## 2022-06-02 DIAGNOSIS — D649 Anemia, unspecified: Secondary | ICD-10-CM | POA: Insufficient documentation

## 2022-07-05 ENCOUNTER — Ambulatory Visit: Payer: Managed Care, Other (non HMO) | Admitting: Nurse Practitioner

## 2022-09-01 ENCOUNTER — Telehealth: Payer: Self-pay | Admitting: Internal Medicine

## 2022-09-01 NOTE — Telephone Encounter (Signed)
Inbound call from patient, anxiously requesting an appointment, patient is having issues swallowing and states yesterday for about twelve hours he was unable to swallow and was regurgitating everything. Patient states his mother passed from esophageal issues and is very worried. Patient was scheduled for 4/10 with Vicie Mutters but he would like sooner appointment with Dr. Carlean Purl.

## 2022-09-01 NOTE — Telephone Encounter (Signed)
Pt stated that he had an episode yesterday evening where he had difficulty swallowing food and liquids. Pt stated that the symptoms resolved this AM and he has been drinking  and eating soft food. Pt was rescheduled for a sooner appointment. Pt was  scheduled to see Dr. Carlean Purl 09/13/2022 at 9:10: Pt made aware.  Pt verbalized understanding with all questions answered.

## 2022-09-13 ENCOUNTER — Ambulatory Visit: Payer: BC Managed Care – PPO | Admitting: Internal Medicine

## 2022-09-13 ENCOUNTER — Encounter: Payer: Self-pay | Admitting: Internal Medicine

## 2022-09-13 VITALS — BP 116/68 | HR 80 | Ht 70.0 in | Wt 232.0 lb

## 2022-09-13 DIAGNOSIS — K200 Eosinophilic esophagitis: Secondary | ICD-10-CM | POA: Diagnosis not present

## 2022-09-13 DIAGNOSIS — R1319 Other dysphagia: Secondary | ICD-10-CM

## 2022-09-13 DIAGNOSIS — D649 Anemia, unspecified: Secondary | ICD-10-CM | POA: Diagnosis not present

## 2022-09-13 NOTE — Progress Notes (Signed)
Phillip Harris 37 y.o. 02-02-1986 [id number]  Assessment & Plan:   Encounter Diagnoses  Name Primary?   Esophageal dysphagia Yes   Eosinophilic esophagitis    Mild anemia     He has a history of suspected eosinophilic esophagitis and this might be recurrent versus GERD induced stricture.  Mother had GE junction cancer and malignancy is always in the differential but seems very unlikely.  Plan for EGD and likely biopsies and dilation tomorrow at 11 AM.  Endoscopy risks review was undertaken, patient has reviewed the informed consent information with respect to EGD and dilation.  He has a trivial anemia back in December, this has not been rechecked.  Probably unrelated to his GI symptoms and could have been related to testosterone levels.  Subjective:   Chief Complaint: Dysphagia  HPI 37 year old married white man with a prior history of dysphagia evaluated in 2007 and suspected to have eosinophilic esophagitis, who has been having intermittent dysphagia but had a severe episode approximately 1 week ago with a 12-hour of food impaction and then had a 20-minute episode 2 days ago.  No heartburn or indigestion symptoms.  Family history pertinent for mother dying from [place] junction carcinoma 2022, diagnosed by me 2020.  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  Barium swallow in 2007 looked normal.   Wt Readings from Last 3 Encounters:  09/13/22 232 lb (105.2 kg)  09/21/21 233 lb 2 oz (105.7 kg)  06/16/21 235 lb (106.6 kg)  2007 pathology . ENDOSCOPIC BIOPSY, STOMACH:   - ESSENTIALLY UNREMARKABLE FRAGMENTS OF FUNDIC-TYPE   GASTRIC MUCOSA   - ANTRAL-TYPE GASTRIC MUCOSA WITH A REACTIVE GASTROPATHY   PATTERN, SEE COMMENT    2. ENDOSCOPIC BIOPSY, MID LOWER ESOPHAGUS:   - BENIGN SQUAMOUS MUCOSA WITH BASAL CELL HYPERPLASIA AND AN   EOSINOPHILIC   INFILTRATE, SEE  COMMENT    3. ENDOSCOPIC BIOPSY, MID UPPER ESOPHAGUS:   - BENIGN SQUAMOUS MUCOSA WITH BASAL CELL HYPERPLASIA AND AN   EOSINOPHILIC   INFILTRATE, SEE COMMENT    Data review shows that he had a employee health visit 06/01/2022, complaining of some fatigue. Ferritin was 73 Heme globin 13.8 normal 14-17.5 with normal white count MCV and platelets Testosterone-total slightly low at 229+ free testosterone normal and normal free percentage at 6.67 and 2.91 respectively retesting showed normal total levels in February on 08/04/2022 No Known Allergies No outpatient medications have been marked as taking for the 09/13/22 encounter (Office Visit) with Gatha Mayer, MD.    Past Medical History:  Diagnosis Date   ADHD (attention deficit hyperactivity disorder)    Dr. 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   Review of Systems See HPI otherwise negative  Objective:   Physical Exam @BP  116/68   Pulse 80   Ht 5\' 10"  (1.778 m)   Wt 232 lb (105.2 kg)   SpO2 96%   BMI 33.29 kg/m @  General:  Well-developed, well-nourished and in no acute distress Eyes:  anicteric. ENT:   Mouth and posterior pharynx free of  lesions.  Lungs: Clear to auscultation bilaterally. Heart:   S1S2, no rubs, murmurs, gallops. Abdomen:  soft, non-tender, no hepatosplenomegaly, hernia, or mass and BS+.  Neuro:  A&O x 3.  Psych:  appropriate mood and  Affect.   Data Reviewed: Per HPI

## 2022-09-13 NOTE — Patient Instructions (Signed)
_You have been scheduled for an endoscopy. Please follow written instructions given to you at your visit today. If you use inhalers (even only as needed), please bring them with you on the day of your procedure. ______________________________________________________  If your blood pressure at your visit was 140/90 or greater, please contact your primary care physician to follow up on this.  _______________________________________________________  If you are age 43 or older, your body mass index should be between 23-30. Your Body mass index is 33.29 kg/m. If this is out of the aforementioned range listed, please consider follow up with your Primary Care Provider.  If you are age 73 or younger, your body mass index should be between 19-25. Your Body mass index is 33.29 kg/m. If this is out of the aformentioned range listed, please consider follow up with your Primary Care Provider.   ________________________________________________________  The [facility] providers would like to encourage you to use [facility] to communicate with providers for non-urgent requests or questions.  Due to long hold times on the telephone, sending your provider a message by [facility] may be a faster and more efficient way to get a response.  Please allow 48 business hours for a response.  Please remember that this is for non-urgent requests.  _______________________________________________________  I appreciate the opportunity to care for you. Silvano Rusk, MD, [facility]

## 2022-09-14 ENCOUNTER — Encounter: Payer: Self-pay | Admitting: Internal Medicine

## 2022-09-14 ENCOUNTER — Ambulatory Visit (AMBULATORY_SURGERY_CENTER): Payer: BC Managed Care – PPO | Admitting: Internal Medicine

## 2022-09-14 VITALS — BP 93/58 | HR 67 | Temp 97.5°F | Resp 20 | Ht 70.0 in | Wt 232.0 lb

## 2022-09-14 DIAGNOSIS — K229 Disease of esophagus, unspecified: Secondary | ICD-10-CM

## 2022-09-14 DIAGNOSIS — R1319 Other dysphagia: Secondary | ICD-10-CM

## 2022-09-14 DIAGNOSIS — K200 Eosinophilic esophagitis: Secondary | ICD-10-CM

## 2022-09-14 MED ORDER — ESOMEPRAZOLE MAGNESIUM 20 MG PO CPDR: 40.0000 mg | DELAYED_RELEASE_CAPSULE | Freq: Every day | ORAL | Status: DC

## 2022-09-14 MED ORDER — SODIUM CHLORIDE 0.9 % IV SOLN: 500.0000 mL | Freq: Once | INTRAVENOUS | Status: DC

## 2022-09-14 MED ORDER — ESOMEPRAZOLE MAGNESIUM 40 MG PO CPDR: 40.0000 mg | DELAYED_RELEASE_CAPSULE | Freq: Every day | ORAL | 3 refills | Status: DC

## 2022-09-14 NOTE — Progress Notes (Signed)
Report to PACU, RN, vss, BBS= Clear.  

## 2022-09-14 NOTE — Progress Notes (Signed)
History and Physical Interval Note:  09/14/2022 11:41 AM  Phillip Harris  has presented today for endoscopic procedure(s), with the diagnosis of  Encounter Diagnosis  Name Primary?   Esophageal dysphagia Yes  .  The various methods of evaluation and treatment have been discussed with the patient and/or family. After consideration of risks, benefits and other options for treatment, the patient has consented to  the endoscopic procedure(s).   The patient's history has been reviewed, patient examined, no change in status, stable for endoscopic procedure(s).  I have reviewed the patient's chart and labs.  Questions were answered to the patient's satisfaction.     Gatha Mayer, MD, [facility]

## 2022-09-14 NOTE — Op Note (Signed)
[facility] Patient Name: Kamir Schuneman Procedure Date: 09/14/2022 11:39 AM MRN: [id number] Endoscopist: Gatha Mayer , MD, [id number] Age: 37 Referring MD:  Date of Birth: 08/27/1985 Gender: Male Account #: [id number] Procedure:                Upper GI endoscopy Indications:              Dysphagia Medicines:                Monitored Anesthesia Care Procedure:                Pre-Anesthesia Assessment:                           - Prior to the procedure, a History and Physical                            was performed, and patient medications and                            allergies were reviewed. The patient's tolerance of                            previous anesthesia was also reviewed. The risks                            and benefits of the procedure and the sedation                            options and risks were discussed with the patient.                            All questions were answered, and informed consent                            was obtained. Prior Anticoagulants: The patient has                            taken no anticoagulant or antiplatelet agents. ASA                            Grade Assessment: II - A patient with mild systemic                            disease. After reviewing the risks and benefits,                            the patient was deemed in satisfactory condition to                            undergo the procedure.                           After obtaining informed consent, the endoscope was  passed under direct vision. Throughout the                            procedure, the patient's blood pressure, pulse, and                            oxygen saturations were monitored continuously. The                            GIF [id number] was introduced through the                            mouth, and advanced to the second part of duodenum.                            The upper GI endoscopy was accomplished  without                            difficulty. The patient tolerated the procedure                            well. Scope In: Scope Out: Findings:                 Mucosal changes including feline appearance,                            longitudinal furrows and white plaques were found                            in the entire esophagus. Biopsies were obtained                            from the proximal and distal esophagus with cold                            forceps for histology of suspected eosinophilic                            esophagitis. Verification of patient identification                            for the specimen was done. Estimated blood loss was                            minimal. The scope was withdrawn. Dilation was                            performed with a Maloney dilator with mild                            resistance at 59 Fr. The dilation site was examined  following endoscope reinsertion and showed mild                            mucosal disruption. Estimated blood loss was                            minimal.                           The exam was otherwise without abnormality.                           The cardia and gastric fundus were normal on                            retroflexion. Complications:            No immediate complications. Estimated Blood Loss:     Estimated blood loss was minimal. Impression:               - Esophageal mucosal changes secondary to                            eosinophilic esophagitis. Dilated.                           - The examination was otherwise normal.                           - Biopsies were taken with a cold forceps for                            evaluation of eosinophilic esophagitis. Recommendation:           - Patient has a contact number available for                            emergencies. The signs and symptoms of potential                            delayed complications were discussed  with the                            patient. Return to normal activities tomorrow.                            Written discharge instructions were provided to the                            patient.                           - Clear liquids x 1 hour then soft foods rest of                            day. Start prior diet tomorrow.                           -  Continue present medications.                           - Await pathology results.                           - start esomeprazole 40 mg qd                           further plans pending path review                           consider dairy and wheat elimination Gatha Mayer, MD 09/14/2022 12:03:26 PM This report has been signed electronically.

## 2022-09-14 NOTE — Progress Notes (Signed)
Pt's states no medical or surgical changes since previsit or office visit. 

## 2022-09-14 NOTE — Patient Instructions (Addendum)
It looks like the eosinophilic esophagitis. Biopsies taken. Dilated also - you may have a sore throat and some mild pain with swallowing - if worse than that let us know. You will ease back into eating.  I will get back soon re: pathology and plans. No signs of cancer or anything bad.  I want you to start esomeprazole - which treats this problem.  In eosinophilic esophagitis (e-o-sin-o-FILL-ik uh-sof-uh-JIE-tis), a type of white blood cell (eosinophil) builds up in the lining of the tube that connects your mouth to your stomach (esophagus). This buildup, which is a reaction to foods, allergens or acid reflux, can inflame or injure the esophageal tissue. Damaged esophageal tissue can lead to difficulty swallowing or cause food to get caught when you swallow.  Eosinophilic esophagitis is a chronic immune system disease. It has been identified only in the past two decades, but is now considered a major cause of digestive system (gastrointestinal) illness. Research is ongoing and will likely lead to revisions in its diagnosis and treatment.  I appreciate the opportunity to care for you. Gatha Mayer, MD, [facility]   Handout on dilation diet given to you  - please follow this today.  Consider wheat and dairy elimination.  YOU HAD AN ENDOSCOPIC PROCEDURE TODAY AT [facility]:   Refer to the procedure report that was given to you for any specific questions about what was found during the examination.  If the procedure report does not answer your questions, please call your gastroenterologist to clarify.  If you requested that your care partner not be given the details of your procedure findings, then the procedure report has been included in a sealed envelope for you to review at your convenience later.  YOU SHOULD EXPECT: Some feelings of bloating in the abdomen. Passage of more gas than usual.  Walking can help get rid of the air that was put into your GI tract during the procedure and  reduce the bloating. If you had a lower endoscopy (such as a colonoscopy or flexible sigmoidoscopy) you may notice spotting of blood in your stool or on the toilet paper. If you underwent a bowel prep for your procedure, you may not have a normal bowel movement for a few days.  Please Note:  You might notice some irritation and congestion in your nose or some drainage.  This is from the oxygen used during your procedure.  There is no need for concern and it should clear up in a day or so.  SYMPTOMS TO REPORT IMMEDIATELY:  Following lower endoscopy (colonoscopy or flexible sigmoidoscopy):  Excessive amounts of blood in the stool  Significant tenderness or worsening of abdominal pains  Swelling of the abdomen that is new, acute  Fever of 100F or higher  Following upper endoscopy (EGD)  Vomiting of blood or coffee ground material  New chest pain or pain under the shoulder blades  Painful or persistently difficult swallowing  New shortness of breath  Fever of 100F or higher  Black, tarry-looking stools  For urgent or emergent issues, a gastroenterologist can be reached at any hour by calling [phone]. Do not use MyChart messaging for urgent concerns.    DIET:  We do recommend a small meal at first, but then you may proceed to your regular diet.  Drink plenty of fluids but you should avoid alcoholic beverages for 24 hours.  ACTIVITY:  You should plan to take it easy for the rest of today and you should NOT  DRIVE or use heavy machinery until tomorrow (because of the sedation medicines used during the test).    FOLLOW UP: Our staff will call the number listed on your records the next business day following your procedure.  We will call around 7:15- 8:00 am to check on you and address any questions or concerns that you may have regarding the information given to you following your procedure. If we do not reach you, we will leave a message.     If any biopsies were taken you will be  contacted by phone or by letter within the next 1-3 weeks.  Please call us at [phone] if you have not heard about the biopsies in 3 weeks.    SIGNATURES/CONFIDENTIALITY: You and/or your care partner have signed paperwork which will be entered into your electronic medical record.  These signatures attest to the fact that that the information above on your After Visit Summary has been reviewed and is understood.  Full responsibility of the confidentiality of this discharge information lies with you and/or your care-partner.

## 2022-09-14 NOTE — Progress Notes (Signed)
Called to room to assist during endoscopic procedure.  Patient ID and intended procedure confirmed with present staff. Received instructions for my participation in the procedure from the performing physician.  

## 2022-09-15 ENCOUNTER — Telehealth: Payer: Self-pay | Admitting: *Deleted

## 2022-09-15 NOTE — Telephone Encounter (Signed)
  Follow up Call-     09/14/2022   10:51 AM  Call back number  Post procedure Call Back phone  # [phone]  Permission to leave phone message Yes     Patient questions:  Do you have a fever, pain , or abdominal swelling? No. Pain Score  0 *  Have you tolerated food without any problems? Yes.    Have you been able to return to your normal activities? Yes.    Do you have any questions about your discharge instructions: Diet   No. Medications  No. Follow up visit  No.  Do you have questions or concerns about your Care? No.  Actions: * If pain score is 4 or above: No action needed, pain <4.

## 2022-09-26 ENCOUNTER — Encounter: Payer: Self-pay | Admitting: Internal Medicine

## 2022-09-26 ENCOUNTER — Other Ambulatory Visit: Payer: Self-pay | Admitting: Internal Medicine

## 2022-09-26 DIAGNOSIS — K200 Eosinophilic esophagitis: Secondary | ICD-10-CM | POA: Insufficient documentation

## 2022-10-04 ENCOUNTER — Ambulatory Visit: Payer: Self-pay | Admitting: Physician Assistant

## 2022-11-14 ENCOUNTER — Ambulatory Visit: Payer: BC Managed Care – PPO | Admitting: Internal Medicine

## 2022-11-27 ENCOUNTER — Ambulatory Visit: Payer: BC Managed Care – PPO | Admitting: Internal Medicine

## 2022-11-27 ENCOUNTER — Encounter: Payer: Self-pay | Admitting: Internal Medicine

## 2022-11-27 VITALS — BP 120/80 | HR 64 | Temp 98.4°F | Resp 12 | Ht 70.0 in | Wt 232.0 lb

## 2022-11-27 DIAGNOSIS — E785 Hyperlipidemia, unspecified: Secondary | ICD-10-CM | POA: Diagnosis not present

## 2022-11-27 DIAGNOSIS — E291 Testicular hypofunction: Secondary | ICD-10-CM

## 2022-11-27 DIAGNOSIS — R5383 Other fatigue: Secondary | ICD-10-CM

## 2022-11-27 NOTE — Patient Instructions (Addendum)
Make an appointment for blood work at your convenience, very early in the morning.      GO TO THE FRONT DESK, PLEASE SCHEDULE YOUR APPOINTMENTS Come back for physical exam in 2 months

## 2022-11-27 NOTE — Assessment & Plan Note (Signed)
Fatigue, low sex drive, poor concentration. Presents with above symptoms, they are going on for a while.  He has nonrestorative sleep. At some point she was Dx with ADD but quit Adderall due to dry mouth and dry eyes. PHQ-9 today: 5, negative. Admitted to episodic snoring, Epworth scale: 8 (average). Labs few months ago show no iron deficiency, normal TSH.  Testosterone was slightly low at x 1, repeated testosterone level was normal. Overall etiology is not clear.  He is certainly very busy and I would anticipate he will be tired however he is concerned about the nonrespiratory sleep and a low sex drive.    Plan:  Early morning labs for total testosterone, FSH, LH, prolactin B12, folic acid, vitamin D.  Consider a sleep study Dyslipidemia: Recheck on RTC. RTC 2 months CPX

## 2022-11-27 NOTE — Progress Notes (Signed)
Subjective:    Patient ID: Phillip Harris, male    DOB: October 21, 1985, 37 y.o.   MRN: [id number]  DOS:  11/27/2022 Type of visit - description: Routine checkup  CC today is  low energy, lack of concentration and decreased sexual drive. This is going on for a while.  He lost his mother about 2 years ago, the grieving process is over and he feels well emotionally.  Physically, other than the fatigue he feels well.  Denies chest pain or difficulty breathing.  No edema. He has a full-time job as a Emergency planning/management officer. Practices martial arts 4 times a week. Things at home are very good with his wife and 3 children.  + Loud snoring only when he is very tired He admits that he wakes up already fatigue. No morning headache, no nocturia.  Was seen at another clinic 06/01/2022 with similar symptoms.  Workup reviewed.  06/01/22: CMP TSH wnl. Hg 13.8 slt low but nl ferritin . LDL 166 06/23/2022: Total testosterone 229- low.  08/04/2022: Total testosterone 344  Review of Systems See above   Past Medical History:  Diagnosis Date   ADHD (attention deficit hyperactivity disorder)    Dr. Reggy Eye   Eosinophilic esophagitis 2007    Past Surgical History:  Procedure Laterality Date   ESOPHAGOGASTRODUODENOSCOPY  2007   With [facility] dilation question EOE   EYE SURGERY Bilateral 2012   [facility]    FRACTURE SURGERY     Left forearm Surgery  2004   REFRACTIVE SURGERY  2012   Right Wrist Surgery  2009    Current Outpatient Medications  Medication Instructions   esomeprazole (NEXIUM) 40 mg, Oral, Every morning       Objective:   Physical Exam BP 120/80 (BP Location: Left Arm, Cuff Size: Large)   Pulse 64   Temp 98.4 F (36.9 C) (Oral)   Resp 12   Ht 5\' 10"  (1.778 m)   Wt 232 lb (105.2 kg)   SpO2 97%   BMI 33.29 kg/m  General:   Well developed, NAD, BMI noted. HEENT:  Normocephalic . Face symmetric, atraumatic Neck: No thyromegaly Lungs:  CTA B Normal respiratory  effort, no intercostal retractions, no accessory muscle use. Heart: RRR,  no murmur.  Lower extremities: no pretibial edema bilaterally  Skin: Not pale. Not jaundice Neurologic:  alert & oriented X3.  Speech normal, gait appropriate for age and unassisted Psych--  Cognition and judgment appear intact.  Cooperative with normal attention span and concentration.  Behavior appropriate. No anxious or depressed appearing.      Assessment     Assessment ADHD s/p eval  Dr. Reggy Eye 2016, Ritalin decreased appetite (as a child); Vyvanse trial 2016 cuased s/e.  Adderall: Dry mouth, dry eyes.    Dyslipidemia   PLAN: Fatigue, low sex drive, poor concentration. Presents with above symptoms, they are going on for a while.  He has nonrestorative sleep. At some point she was Dx with ADD but quit Adderall due to dry mouth and dry eyes. PHQ-9 today: 5, negative. Admitted to episodic snoring, Epworth scale: 8 (average). Labs few months ago show no iron deficiency, normal TSH.  Testosterone was slightly low at x 1, repeated testosterone level was normal. Overall etiology is not clear.  He is certainly very busy and I would anticipate he will be tired however he is concerned about the nonrespiratory sleep and a low sex drive.    Plan:  Early morning labs for total testosterone, FSH,  LH, prolactin B12, folic acid, vitamin D.  Consider a sleep study Dyslipidemia: Recheck on RTC. RTC 2 months CPX

## 2022-11-28 ENCOUNTER — Other Ambulatory Visit (INDEPENDENT_AMBULATORY_CARE_PROVIDER_SITE_OTHER): Payer: BC Managed Care – PPO

## 2022-11-28 DIAGNOSIS — E230 Hypopituitarism: Secondary | ICD-10-CM

## 2022-11-28 DIAGNOSIS — E291 Testicular hypofunction: Secondary | ICD-10-CM

## 2022-11-28 DIAGNOSIS — R5383 Other fatigue: Secondary | ICD-10-CM

## 2022-11-28 LAB — LUTEINIZING HORMONE: LH: 4.48 m[IU]/mL (ref 1.50–9.30)

## 2022-11-28 LAB — VITAMIN B12: Vitamin B-12: 342 pg/mL (ref 211–911)

## 2022-11-28 LAB — FOLLICLE STIMULATING HORMONE: FSH: 2.7 m[IU]/mL (ref 1.4–18.1)

## 2022-11-28 LAB — TESTOSTERONE: Testosterone: 223.09 ng/dL — ABNORMAL LOW (ref 300.00–890.00)

## 2022-11-28 LAB — VITAMIN D 25 HYDROXY (VIT D DEFICIENCY, FRACTURES): VITD: 17.69 ng/mL — ABNORMAL LOW (ref 30.00–100.00)

## 2022-11-28 LAB — FOLATE: Folate: 16.9 ng/mL (ref >5.9)

## 2022-11-29 LAB — PROLACTIN: Prolactin: 7.9 ng/mL (ref 2.0–18.0)

## 2022-11-30 MED ORDER — VITAMIN D (ERGOCALCIFEROL) 1.25 MG (50000 UNIT) PO CAPS: 50000.0000 [IU] | ORAL_CAPSULE | ORAL | 0 refills | Status: AC

## 2022-11-30 NOTE — Addendum Note (Signed)
Addended by: Conrad Earlimart D on: 11/30/2022 11:14 AM   Modules accepted: Orders

## 2022-12-11 ENCOUNTER — Encounter: Payer: Self-pay | Admitting: Internal Medicine

## 2022-12-11 DIAGNOSIS — E291 Testicular hypofunction: Secondary | ICD-10-CM

## 2022-12-15 NOTE — Addendum Note (Signed)
Addended byConrad Terlton D on: 12/15/2022 10:40 AM   Modules accepted: Orders

## 2022-12-15 NOTE — Telephone Encounter (Signed)
Referral placed to Dr. Sharl Ma. Pt informed.

## 2022-12-15 NOTE — Telephone Encounter (Signed)
Please refer him to a different endocrinology group.  Let him know and give him a contact number. Also let him know that no matter what endocrinology group we are referring him to, it will take several weeks for him to be seen

## 2023-01-04 ENCOUNTER — Encounter: Payer: Self-pay | Admitting: Medical-Surgical

## 2023-01-04 ENCOUNTER — Ambulatory Visit: Payer: BC Managed Care – PPO | Admitting: Medical-Surgical

## 2023-01-04 VITALS — BP 118/78 | HR 77 | Temp 97.6°F | Ht 70.0 in | Wt 233.5 lb

## 2023-01-04 DIAGNOSIS — E291 Testicular hypofunction: Secondary | ICD-10-CM | POA: Diagnosis not present

## 2023-01-04 DIAGNOSIS — F909 Attention-deficit hyperactivity disorder, unspecified type: Secondary | ICD-10-CM | POA: Diagnosis not present

## 2023-01-04 DIAGNOSIS — G4719 Other hypersomnia: Secondary | ICD-10-CM

## 2023-01-04 DIAGNOSIS — Z7689 Persons encountering health services in other specified circumstances: Secondary | ICD-10-CM

## 2023-01-04 DIAGNOSIS — R0683 Snoring: Secondary | ICD-10-CM

## 2023-01-04 MED ORDER — TESTOSTERONE CYPIONATE 200 MG/ML IM SOLN
200.0000 mg | Freq: Once | INTRAMUSCULAR | Status: DC
Start: 2023-01-04 — End: 2023-01-18

## 2023-01-04 MED ORDER — TESTOSTERONE CYPIONATE 200 MG/ML IM SOLN
200.0000 mg | INTRAMUSCULAR | 0 refills | Status: DC
Start: 2023-01-04 — End: 2023-01-04

## 2023-01-04 NOTE — Progress Notes (Signed)
New Patient Office Visit  Subjective:  Patient ID: Phillip Harris, male    DOB: [birth year]  Age: 37 y.o. MRN: [id number]  CC:  Chief Complaint  Patient presents with   Establish Care    Pt here to establish new care    HPI Phillip Harris presents to establish care. 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 He is married with three children and enjoys martial arts.   History of ADHD but not currently on treatment. Tried both Vyvanse and Adderall but had side effects like sleep disturbance and extremely dry mouth. Is open to options for treatment but understandably concerned about intolerances/SE.   Has recently been struggling with low testosterone and the associated fatigue. Recently had labs checked with confirmation of low testosterone and normal LH/FSH. Was referred to endocrinology by his prior PCP but did not receive a call to schedule. A second referral was placed with the same result. Today, he is interested in starting TRT. He does have small children and his wife so using topical testosterone would be a concern due to risk of transference. Is open to injections.   With his fatigue levels, there was some concern for possible sleep apnea. He does snore loudly but has never been told he stops breathing in his sleep. Feels tired during the day.   Past Medical History:  Diagnosis Date   ADHD (attention deficit hyperactivity disorder)    Dr. [facility]   Eosinophilic esophagitis 2007    Past Surgical History:  Procedure Laterality Date   ESOPHAGOGASTRODUODENOSCOPY  2007   With [facility] dilation question EOE   EYE SURGERY Bilateral 2012   Laser Eye Surgery    FRACTURE SURGERY     Left forearm Surgery  2004   REFRACTIVE SURGERY  2012   Right Wrist Surgery  2009    Family History  Problem Relation Age of Onset   Diabetes Mother    Gastric cancer Mother        gastroesophageal junction cancer   Hypertension Mother     Hyperlipidemia Mother    Hypothyroidism Mother    Diabetes Father    Hyperlipidemia Father    Hypertension Father    Prostate cancer Neg Hx    Colon cancer Neg Hx    CAD Neg Hx    Liver disease Neg Hx     Social History   Socioeconomic History   Marital status: Married    Spouse name: Not on file   Number of children: 3   Years of education: Not on file   Highest education level: Associate degree: occupational, Scientist, product/process development, or vocational program  Occupational History   Occupation: Technical sales engineer, Technical sales engineer  Tobacco Use   Smoking status: Never   Smokeless tobacco: Never  Vaping Use   Vaping status: Never Used  Substance and Sexual Activity   Alcohol use: Yes    Alcohol/week: 0.0 standard drinks of alcohol    Comment: occasionally   Drug use: No   Sexual activity: Not on file  Other Topics Concern   Not on file  Social History Narrative    Lives with wife, Phillip Harris Endo   1 child born 2019, boy +33 twin boys 2023   Phillip Harris, passed away Nov 04, 2020   Police officer Phillip Harris, prior to that [employer]   1-2 caffeinated beverages daily 0 alcohol never smoker no drug use   Social Determinants of Corporate investment banker  Strain: Low Risk  (11/22/2022)   Overall Financial Resource Strain (CARDIA)    Difficulty of Paying Living Expenses: Not hard at all  Food Insecurity: No Food Insecurity (11/22/2022)   Hunger Vital Sign    Worried About Running Out of Food in the Last Year: Never true    Ran Out of Food in the Last Year: Never true  Transportation Needs: No Transportation Needs (11/22/2022)   PRAPARE - Administrator, Civil Service (Medical): No    Lack of Transportation (Non-Medical): No  Physical Activity: Sufficiently Active (11/22/2022)   Exercise Vital Sign    Days of Exercise per Week: 5 days    Minutes of Exercise per Session: 60 min  Stress: No Stress Concern Present (11/22/2022)   [employer] of Occupational Health  - Occupational Stress Questionnaire    Feeling of Stress : Not at all  Social Connections: Moderately Integrated (11/22/2022)   Social Connection and Isolation Panel [NHANES]    Frequency of Communication with Friends and Family: More than three times a week    Frequency of Social Gatherings with Friends and Family: Twice a week    Attends Religious Services: More than 4 times per year    Active Member of [employer] or Organizations: No    Attends Engineer, structural: Not on file    Marital Status: Married  Catering manager Violence: Not on file    ROS Review of Systems  Constitutional:  Positive for fatigue. Negative for chills, fever and unexpected weight change.  HENT:  Negative for congestion, rhinorrhea, sinus pressure and sore throat.   Eyes:  Negative for visual disturbance.  Respiratory:  Negative for cough, chest tightness, shortness of breath and wheezing.   Cardiovascular:  Negative for chest pain, palpitations and leg swelling.  Gastrointestinal:  Negative for abdominal pain, constipation, diarrhea, nausea and vomiting.  Genitourinary:  Negative for dysuria, frequency and urgency.  Skin:  Negative for rash.  Neurological:  Negative for dizziness, light-headedness and headaches.  Psychiatric/Behavioral:  Positive for decreased concentration. Negative for dysphoric mood, self-injury, sleep disturbance and suicidal ideas. The patient is not nervous/anxious.     Objective:   Today's Vitals: BP 118/78 (BP Location: Left Arm, Patient Position: Sitting, Cuff Size: Large)   Pulse 77   Temp 97.6 F (36.4 C)   Ht 5\' 10"  (1.778 m)   Wt 233 lb 8 oz (105.9 kg)   SpO2 100%   BMI 33.50 kg/m   Physical Exam Vitals reviewed.  Constitutional:      General: He is not in acute distress.    Appearance: Normal appearance. He is not ill-appearing.  HENT:     Head: Normocephalic and atraumatic.  Cardiovascular:     Rate and Rhythm: Normal rate and regular rhythm.     Pulses:  Normal pulses.     Heart sounds: Normal heart sounds. No murmur heard.    No friction rub. No gallop.  Pulmonary:     Effort: Pulmonary effort is normal. No respiratory distress.     Breath sounds: Normal breath sounds.  Skin:    General: Skin is warm and dry.  Neurological:     Mental Status: He is alert and oriented to person, place, and time.  Psychiatric:        Mood and Affect: Mood normal.        Behavior: Behavior normal.        Thought Content: Thought content normal.  Judgment: Judgment normal.    Assessment & Plan:   1. Encounter to establish care Reviewed available information and discussed care concerns with patient.   2. Loud snoring 3. Excessive daytime sleepiness Moderate risk on STOPBANG criteria. Ordering home sleep study.  - Home sleep test; Future  4. Hypogonadism in male Labs consistent with hypogonadism. Starting testosterone cypionate 200mg  every 14 days. First injection given in office. We will have him back for a NV for his next injection in two weeks. Would like to provide patient education at that time to allow for self injection at home if desires. Plan to recheck testosterone levels one week after the third injection to evaluate tolerance and repose.  - testosterone cypionate (DEPOTESTOSTERONE CYPIONATE) injection 200 mg  5. Attention deficit hyperactivity disorder (ADHD), unspecified ADHD type Discussed other medications that may be beneficial such as Concerta, Ritalin, or non stimulants. Plan to address testosterone and sleep study initially and then evaluate options for treatment if still desired.   Outpatient Encounter Medications as of 01/04/2023  Medication Sig   esomeprazole (NEXIUM) 40 MG capsule Take 40 mg by mouth every morning.   Vitamin D, Ergocalciferol, (DRISDOL) 1.25 MG (50000 UNIT) CAPS capsule Take 1 capsule (50,000 Units total) by mouth every 7 (seven) days.   [DISCONTINUED] testosterone cypionate (DEPOTESTOSTERONE CYPIONATE)  200 MG/ML injection Inject 1 mL (200 mg total) into the muscle every 14 (fourteen) days.   Facility-Administered Encounter Medications as of 01/04/2023  Medication   testosterone cypionate (DEPOTESTOSTERONE CYPIONATE) injection 200 mg    Follow-up: Return in about 2 weeks (around 01/18/2023) for nurse visit for testosterone injection (please do patient teaching for self injection).   Thayer Ohm, DNP, APRN, FNP-BC [facility] and Sports Medicine

## 2023-01-10 ENCOUNTER — Telehealth: Payer: Self-pay | Admitting: Medical-Surgical

## 2023-01-10 NOTE — Telephone Encounter (Addendum)
Phillip Harris with [facility] Medical records called. They got a medical records request  for patient's visits with  Dr. Willow Ora.  Medical records can be viewed in Epic.

## 2023-01-12 ENCOUNTER — Encounter: Payer: Self-pay | Admitting: Medical-Surgical

## 2023-01-18 ENCOUNTER — Ambulatory Visit (INDEPENDENT_AMBULATORY_CARE_PROVIDER_SITE_OTHER): Payer: BC Managed Care – PPO | Admitting: Medical-Surgical

## 2023-01-18 VITALS — BP 125/65 | HR 64

## 2023-01-18 DIAGNOSIS — E291 Testicular hypofunction: Secondary | ICD-10-CM | POA: Diagnosis not present

## 2023-01-18 MED ORDER — TESTOSTERONE CYPIONATE 200 MG/ML IM SOLN: 200.0000 mg | INTRAMUSCULAR | 3 refills | Status: DC

## 2023-01-18 MED ORDER — TESTOSTERONE CYPIONATE 200 MG/ML IM SOLN
200.0000 mg | Freq: Once | INTRAMUSCULAR | Status: AC
Start: 2023-01-18 — End: 2023-01-18
  Administered 2023-01-18: 200 mg via INTRAMUSCULAR

## 2023-01-18 MED ORDER — "NEEDLE (DISP) 18G X 1"" MISC": 99 refills | Status: DC

## 2023-01-18 MED ORDER — "SYRINGE LUER LOCK 21G X 1-1/2"" 3 ML MISC": 99 refills | Status: DC

## 2023-01-18 NOTE — Progress Notes (Signed)
Plan to return 1 week after the third injection for recheck of testosterone levels. Orders already placed.   Medical screening examination/treatment was performed by qualified clinical staff member and as supervising provider I was immediately available for consultation/collaboration. I have reviewed documentation and agree with assessment and plan.  Thayer Ohm, DNP, APRN, FNP-BC [facility] and [facility] Medicine

## 2023-01-18 NOTE — Progress Notes (Signed)
   Established Patient Office Visit  Subjective   Patient ID: Phillip Harris, male    DOB: 1985/08/04  Age: 37 y.o. MRN: [id number]  Chief Complaint  Patient presents with   Hypogonadism    HPI  Phillip Harris is here for a education of testosterone injection.   ROS    Objective:     BP 125/65   Pulse 64   SpO2 97%    Physical Exam   No results found for any visits on 01/18/23.    The ASCVD Risk score (Arnett DK, et al., 2019) failed to calculate for the following reasons:   The 2019 ASCVD risk score is only valid for ages 79 to 11    Assessment & Plan:  Testosterone injection - Patient gave to himself without complication. He was advised to follow up in 3 months for labs. Advised to give himself injections every 2 weeks.   Problem List Items Addressed This Visit   None Visit Diagnoses     Hypogonadism in male    -  Primary   Relevant Medications   testosterone cypionate (DEPOTESTOSTERONE CYPIONATE) injection 200 mg (Completed) (Start on 01/18/2023  9:15 AM)       Return in about 3 months (around 04/20/2023) for testosterone lab. Earna Coder, Janalyn Harder, CMA

## 2023-01-18 NOTE — Progress Notes (Signed)
Left a message advising of recommendations.  

## 2023-01-26 ENCOUNTER — Ambulatory Visit: Payer: BC Managed Care – PPO | Admitting: Medical-Surgical

## 2023-01-26 ENCOUNTER — Encounter: Payer: Self-pay | Admitting: Medical-Surgical

## 2023-01-26 ENCOUNTER — Encounter: Payer: BC Managed Care – PPO | Admitting: Internal Medicine

## 2023-01-26 VITALS — BP 119/77 | HR 76 | Ht 70.0 in | Wt 235.1 lb

## 2023-01-26 DIAGNOSIS — E291 Testicular hypofunction: Secondary | ICD-10-CM

## 2023-01-26 DIAGNOSIS — R5383 Other fatigue: Secondary | ICD-10-CM

## 2023-01-26 DIAGNOSIS — Z0000 Encounter for general adult medical examination without abnormal findings: Secondary | ICD-10-CM | POA: Diagnosis not present

## 2023-01-26 DIAGNOSIS — E661 Drug-induced obesity: Secondary | ICD-10-CM

## 2023-01-26 DIAGNOSIS — Z6833 Body mass index (BMI) 33.0-33.9, adult: Secondary | ICD-10-CM

## 2023-01-26 DIAGNOSIS — E785 Hyperlipidemia, unspecified: Secondary | ICD-10-CM | POA: Diagnosis not present

## 2023-01-26 DIAGNOSIS — F909 Attention-deficit hyperactivity disorder, unspecified type: Secondary | ICD-10-CM

## 2023-01-26 MED ORDER — METHYLPHENIDATE HCL ER (OSM) 27 MG PO TBCR: 27.0000 mg | EXTENDED_RELEASE_TABLET | ORAL | 0 refills | Status: DC

## 2023-01-26 NOTE — Progress Notes (Signed)
Complete physical exam  Patient: Phillip Harris   DOB: 1985/09/15   36 y.o. Male  MRN: [id number]  Subjective:    Chief Complaint  Patient presents with   Annual Exam   Phillip Harris is a 37 y.o. male who presents today for a complete physical exam. He reports consuming a general diet.  Martial arts 3-4 times weekly.  He generally feels well. He reports sleeping well. He does not have additional problems to discuss today.   Most recent fall risk assessment:    01/26/2023    1:47 PM  Fall Risk   Number falls in past yr: 0  Injury with Fall? 0  Risk for fall due to : No Fall Risks  Follow up Falls evaluation completed     Most recent depression screenings:    01/26/2023    1:47 PM 01/10/2023    1:42 PM  PHQ 2/9 Scores  PHQ - 2 Score 0 0  PHQ- 9 Score  7    Vision:Within last year, Dental: No current dental problems and Receives regular dental care, and STD: The patient denies history of sexually transmitted disease.    Patient Care Team: Christen Butter, NP as PCP - General (Nurse Practitioner)   Outpatient Medications Prior to Visit  Medication Sig   esomeprazole (NEXIUM) 40 MG capsule Take 40 mg by mouth every morning.   NEEDLE, DISP, 18 G 18G X 1" MISC Injection testosterone every 14 days.   Syringe/Needle, Disp, (SYRINGE LUER LOCK) 21G X 1-1/2" 3 ML MISC Injection testosterone every 14 days.   testosterone cypionate (DEPOTESTOSTERONE CYPIONATE) 200 MG/ML injection Inject 1 mL (200 mg total) into the muscle every 14 (fourteen) days.   Vitamin D, Ergocalciferol, (DRISDOL) 1.25 MG (50000 UNIT) CAPS capsule Take 1 capsule (50,000 Units total) by mouth every 7 (seven) days.   No facility-administered medications prior to visit.    Review of Systems  Constitutional:  Positive for malaise/fatigue. Negative for chills, fever and weight loss.  HENT:  Positive for sore throat (On waking in the mornings). Negative for congestion, ear pain, hearing loss and sinus  pain.   Eyes:  Negative for blurred vision, photophobia and pain.  Respiratory:  Negative for cough, shortness of breath and wheezing.   Cardiovascular:  Negative for chest pain, palpitations and leg swelling.  Gastrointestinal:  Negative for abdominal pain, constipation, diarrhea, heartburn, nausea and vomiting.  Genitourinary:  Negative for dysuria, frequency and urgency.  Musculoskeletal:  Negative for falls and neck pain.  Skin:  Negative for itching and rash.  Neurological:  Negative for dizziness, weakness and headaches.  Endo/Heme/Allergies:  Negative for polydipsia. Does not bruise/bleed easily.  Psychiatric/Behavioral:  Negative for depression, substance abuse and suicidal ideas. The patient is not nervous/anxious and does not have insomnia.        Inattention     Objective:    BP 119/77   Pulse 76   Ht 5\' 10"  (1.778 m)   Wt 235 lb 1.3 oz (106.6 kg)   SpO2 96%   BMI 33.73 kg/m    Physical Exam Constitutional:      General: He is not in acute distress.    Appearance: Normal appearance. He is not ill-appearing.  HENT:     Head: Normocephalic and atraumatic.     Right Ear: Tympanic membrane, ear canal and external ear normal. There is no impacted cerumen.     Left Ear: Tympanic membrane, ear canal and external ear normal. There is no  impacted cerumen.     Nose: Nose normal. No congestion or rhinorrhea.     Mouth/Throat:     Mouth: Mucous membranes are moist.     Pharynx: No oropharyngeal exudate or posterior oropharyngeal erythema.  Eyes:     General: No scleral icterus.       Right eye: No discharge.        Left eye: No discharge.     Extraocular Movements: Extraocular movements intact.     Conjunctiva/sclera: Conjunctivae normal.     Pupils: Pupils are equal, round, and reactive to light.  Neck:     Thyroid: No thyromegaly.     Vascular: No carotid bruit or JVD.     Trachea: Trachea normal.  Cardiovascular:     Rate and Rhythm: Normal rate and regular rhythm.      Pulses: Normal pulses.     Heart sounds: Normal heart sounds. No murmur heard.    No friction rub. No gallop.  Pulmonary:     Effort: Pulmonary effort is normal. No respiratory distress.     Breath sounds: Normal breath sounds. No wheezing.  Abdominal:     General: Bowel sounds are normal. There is no distension.     Palpations: Abdomen is soft.     Tenderness: There is no abdominal tenderness. There is no guarding.  Musculoskeletal:        General: Normal range of motion.     Cervical back: Normal range of motion and neck supple.  Lymphadenopathy:     Cervical: No cervical adenopathy.  Skin:    General: Skin is warm and dry.  Neurological:     Mental Status: He is alert and oriented to person, place, and time.     Cranial Nerves: No cranial nerve deficit.  Psychiatric:        Mood and Affect: Mood normal.        Behavior: Behavior normal.        Thought Content: Thought content normal.        Judgment: Judgment normal.      No results found for any visits on 01/26/23.     Assessment & Plan:    Routine Health Maintenance and Physical Exam  Immunization History  Administered Date(s) Administered   Hepatitis B 04/01/2010, 10/11/2010, 09/18/2014   PFIZER(Purple Top)SARS-COV-2 Vaccination 02/22/2020, 03/14/2020   Tdap 04/17/2013, 07/30/2018    Health Maintenance  Topic Date Due   INFLUENZA VACCINE  09/24/2023 (Originally 01/25/2023)   Hepatitis C Screening  11/26/2025 (Originally 02/04/2005)   COVID-19 Vaccine (3 - Pfizer risk series) 12/12/2025 (Originally 04/11/2020)   DTaP/Tdap/Td (3 - Td or Tdap) 07/30/2028   HIV Screening  Completed   HPV VACCINES  Aged Out    Discussed health benefits of physical activity, and encouraged him to engage in regular exercise appropriate for his age and condition.  1. Annual physical exam Recent labs on file and reviewed.  No additional labs today.  Up-to-date on preventative care.  Wellness information provided with AVS.  2.  Hypogonadism in male Still waiting on insurance approval for testosterone injections.  Okay to schedule nurse visit to have this done next week.  Prior authorization staff members notified of need for prior authorization and will try to expedite if possible.  3. Other fatigue Continues to have significant fatigue.  Pending sleep study.  Order in place however he has not received a call from this.  Touched base with our referral coordinator who reports that the sleep center is  unsure when they will be able to get to it.  Seeing this to a another provider to see if we can expedite the sleep study.  4. Dyslipidemia Up-to-date on lipid checks.  5. Attention deficit hyperactivity disorder (ADHD), unspecified ADHD type Previously discussed ADHD.  Not currently on any medications but is interested in starting something.  Previously intolerant of Adderall and Vyvanse due to significant side effects.  Was on Ritalin in early childhood.  Discussed various options.  Plan to start Concerta 27 mg daily to see if this is better tolerated and helpful.  Advised to reach out in the next 3 weeks or so and let me know how it is going on the medication and if there are any concerns.  Patient verbalized understanding is agreeable to the plan.  6. 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  Has some room for improvement in his dietary habits.  We briefly discussed medication options for weight loss.  Advise he contact his insurance company to see if there are any weight loss medications that are covered under his plan.  Originally discussed starting phentermine to help with weight loss over the next few months but holding off on that since we are going to give Concerta a try.  If he is able to get a list of medications that are covered for weight loss from his insurance company, we can certainly  review the options and determine the best approach.  Return in about 3 months (around 04/28/2023) for ADHD follow up.   Christen Butter, NP

## 2023-01-30 NOTE — Progress Notes (Signed)
Patient advised.

## 2023-01-31 ENCOUNTER — Ambulatory Visit (INDEPENDENT_AMBULATORY_CARE_PROVIDER_SITE_OTHER): Payer: BC Managed Care – PPO | Admitting: Family Medicine

## 2023-01-31 ENCOUNTER — Encounter: Payer: Self-pay | Admitting: Medical-Surgical

## 2023-01-31 VITALS — BP 120/73 | HR 71 | Resp 20 | Ht 70.0 in | Wt 238.0 lb

## 2023-01-31 DIAGNOSIS — E291 Testicular hypofunction: Secondary | ICD-10-CM | POA: Diagnosis not present

## 2023-01-31 MED ORDER — TESTOSTERONE CYPIONATE 200 MG/ML IM SOLN
200.0000 mg | Freq: Once | INTRAMUSCULAR | Status: AC
Start: 2023-01-31 — End: 2023-01-31
  Administered 2023-01-31: 200 mg via INTRAMUSCULAR

## 2023-01-31 MED ORDER — TESTOSTERONE CYPIONATE 200 MG/ML IM SOLN
100.0000 mg | Freq: Once | INTRAMUSCULAR | Status: DC
Start: 2023-01-31 — End: 2023-01-31

## 2023-01-31 NOTE — Progress Notes (Signed)
   Subjective:    Patient ID: Phillip Harris, male    DOB: 02/08/1986, 37 y.o.   MRN: [id number]  HPI Patient is here for a testosterone injection. Denies chest pain, shortness of breath, headaches and problems with medication or mood changes.    Review of Systems     Objective:   Physical Exam        Assessment & Plan:   Patient tolerated injection well without complications. Patient advised to schedule next injection in 14 days.

## 2023-01-31 NOTE — Progress Notes (Signed)
Medical screening examination/treatment was performed by qualified clinical staff member and as supervising physician I was immediately available for consultation/collaboration. I have reviewed documentation and agree with assessment and plan.  Luigi Stuckey, DO  

## 2023-02-06 ENCOUNTER — Other Ambulatory Visit: Payer: BC Managed Care – PPO

## 2023-02-07 ENCOUNTER — Encounter: Payer: Self-pay | Admitting: Medical-Surgical

## 2023-02-09 MED ORDER — METHYLPHENIDATE HCL ER (OSM) 18 MG PO TBCR: 18.0000 mg | EXTENDED_RELEASE_TABLET | ORAL | 0 refills | Status: DC

## 2023-02-14 ENCOUNTER — Ambulatory Visit: Payer: BC Managed Care – PPO | Admitting: Medical-Surgical

## 2023-02-14 VITALS — BP 135/72 | HR 62 | Resp 20 | Ht 70.0 in | Wt 235.0 lb

## 2023-02-14 DIAGNOSIS — E291 Testicular hypofunction: Secondary | ICD-10-CM | POA: Diagnosis not present

## 2023-02-14 MED ORDER — TESTOSTERONE CYPIONATE 200 MG/ML IM SOLN
200.0000 mg | Freq: Once | INTRAMUSCULAR | Status: AC
Start: 2023-02-14 — End: 2023-02-14
  Administered 2023-02-14: 200 mg via INTRAMUSCULAR

## 2023-02-14 MED ORDER — TESTOSTERONE CYPIONATE 200 MG/ML IM SOLN
100.0000 mg | Freq: Once | INTRAMUSCULAR | Status: DC
Start: 2023-02-14 — End: 2023-02-14

## 2023-02-14 MED ORDER — TESTOSTERONE CYPIONATE 200 MG/ML IM SOLN: 100.0000 mg | INTRAMUSCULAR | 3 refills | Status: DC

## 2023-02-14 NOTE — Progress Notes (Signed)
   Subjective:    Patient ID: Phillip Harris, male    DOB: [birth year], 37 y.o.   MRN: [id number]  HPI  Patient is here for a testosterone injection. Denies chest pain, shortness of breath, headaches and problems with medication or mood changes.   Review of Systems     Objective:   Physical Exam        Assessment & Plan:   Patient tolerated injection well without complications. Patient advised to schedule next injection in 14 days.   Location: Right Anterior thigh

## 2023-02-14 NOTE — Progress Notes (Signed)
Medical screening examination/treatment was performed by qualified clinical staff member and as supervising provider I was immediately available for consultation/collaboration. I have reviewed documentation and agree with assessment and plan.  Insurance authorization received for testosterone. Resending testosterone prescription to cover 100mg  weekly. Once this is on board, ok to do injections at  home.  Thayer Ohm, DNP, APRN, FNP-BC [facility] and Sports Medicine

## 2023-02-19 ENCOUNTER — Encounter: Payer: Self-pay | Admitting: Medical-Surgical

## 2023-02-19 ENCOUNTER — Other Ambulatory Visit: Payer: Self-pay | Admitting: Internal Medicine

## 2023-02-19 DIAGNOSIS — E559 Vitamin D deficiency, unspecified: Secondary | ICD-10-CM

## 2023-02-27 ENCOUNTER — Encounter: Payer: Self-pay | Admitting: Medical-Surgical

## 2023-02-27 ENCOUNTER — Telehealth (INDEPENDENT_AMBULATORY_CARE_PROVIDER_SITE_OTHER): Payer: BC Managed Care – PPO | Admitting: Medical-Surgical

## 2023-02-27 DIAGNOSIS — J069 Acute upper respiratory infection, unspecified: Secondary | ICD-10-CM | POA: Diagnosis not present

## 2023-02-27 DIAGNOSIS — R059 Cough, unspecified: Secondary | ICD-10-CM

## 2023-02-27 MED ORDER — NIRMATRELVIR/RITONAVIR (PAXLOVID)TABLET
3.0000 | ORAL_TABLET | Freq: Two times a day (BID) | ORAL | 0 refills | Status: AC
Start: 2023-02-27 — End: 2023-03-04

## 2023-02-27 MED ORDER — HYDROCOD POLI-CHLORPHE POLI ER 10-8 MG/5ML PO SUER
5.0000 mL | Freq: Two times a day (BID) | ORAL | 0 refills | Status: AC | PRN
Start: 2023-02-27 — End: ?

## 2023-02-27 MED ORDER — OSELTAMIVIR PHOSPHATE 75 MG PO CAPS
75.0000 mg | ORAL_CAPSULE | Freq: Two times a day (BID) | ORAL | 0 refills | Status: AC
Start: 2023-02-27 — End: ?

## 2023-02-27 NOTE — Progress Notes (Signed)
Virtual Visit via Video Note  I connected with Phillip Harris on 02/27/23 at  3:20 PM EDT by a video enabled telemedicine application and verified that I am speaking with the correct person using two identifiers.   I discussed the limitations of evaluation and management by telemedicine and the availability of in person appointments. The patient expressed understanding and agreed to proceed.  Patient location: home Provider locations: office  Subjective:    CC: fever, weakness  HPI: Pleasant 37 year old male presenting today via MyChart video visit with reports of waking yesterday morning shivering. He was able to take a shower and the shivering stopped so he went to work. Halfway through the day he was feeling progressively worse so he went home, took some tylenol/ibuprofen and some sleep gummies then went to bed. Fever yesterday t-max 103.6. Reports feeling very weak and tired with poor appetite. Today, he remains feverish although now 100.6. Having a wheezing cough that is productive of tannish-gray sputum. Has pressure and pain behind both eyes. Can hear congestion in his chest when breathing deep/coughing. Denies N/V/D, chest pain.    Past medical history, Surgical history, Family history not pertinant except as noted below, Social history, Allergies, and medications have been entered into the medical record, reviewed, and corrections made.   Review of Systems: See HPI for pertinent positives and negatives.   Objective:    General: Speaking clearly in complete sentences without any shortness of breath.  Alert and oriented x3.  Normal judgment. No apparent acute distress.  Impression and Recommendations:    1. Viral URI with cough Recommend drive up testing for flu and covid but patient is unable to drive himself here with his current symptoms and does not want to expose his wife and kids to the current illness. Does not have a covid test at home. 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 Adding Tussionex for severe cough. Ok to use OTC cold/flu medications for symptoms management. Tylenol/ibuprofen for HA/BA/sore throat. If symptoms do not begin to lessen/improve in 48-72 hours or if symptoms worsen, recommend in person evaluation.  - nirmatrelvir/ritonavir (PAXLOVID) 20 x 150 MG & 10 x 100MG  TABS; Take 3 tablets by mouth 2 (two) times daily for 5 days. (Take nirmatrelvir 150 mg two tablets twice daily for 5 days and ritonavir 100 mg one tablet twice daily for 5 days) Patient GFR is >90  Dispense: 30 tablet; Refill: 0 - oseltamivir (TAMIFLU) 75 MG capsule; Take 1 capsule (75 mg total) by mouth 2 (two) times daily.  Dispense: 10 capsule; Refill: 0 - chlorpheniramine-HYDROcodone (TUSSIONEX) 10-8 MG/5ML; Take 5 mLs by mouth every 12 (twelve) hours as needed for cough (cough, will cause drowsiness.).  Dispense: 120 mL; Refill: 0   I discussed the assessment and treatment plan with the patient. The patient was provided an opportunity to ask questions and all were answered. The patient agreed with the plan and demonstrated an understanding of the instructions.   The patient was advised to call back or seek an in-person evaluation if the symptoms worsen or if the condition fails to improve as anticipated.  Return if symptoms worsen or fail to improve.  Thayer Ohm, DNP, APRN, FNP-BC [facility] and [facility] Medicine

## 2023-02-28 ENCOUNTER — Ambulatory Visit: Payer: BC Managed Care – PPO

## 2023-03-06 ENCOUNTER — Ambulatory Visit (INDEPENDENT_AMBULATORY_CARE_PROVIDER_SITE_OTHER): Payer: BC Managed Care – PPO | Admitting: Medical-Surgical

## 2023-03-06 VITALS — BP 122/71 | HR 75 | Temp 98.4°F | Ht 70.0 in

## 2023-03-06 DIAGNOSIS — Z23 Encounter for immunization: Secondary | ICD-10-CM

## 2023-03-06 DIAGNOSIS — E291 Testicular hypofunction: Secondary | ICD-10-CM

## 2023-03-06 MED ORDER — TESTOSTERONE CYPIONATE 200 MG/ML IM SOLN
200.0000 mg | Freq: Once | INTRAMUSCULAR | Status: AC
Start: 2023-03-06 — End: 2023-03-06
  Administered 2023-03-06: 200 mg via INTRAMUSCULAR

## 2023-03-06 NOTE — Progress Notes (Signed)
   Established Patient Office Visit  Subjective   Patient ID: Phillip Harris, male    DOB: April 09, 1986  Age: 37 y.o. MRN: [id number]  Chief Complaint  Patient presents with   hypogonadism in male    Testosterone injection nurse visit.     HPI  Hypogonadism in male. Testosterone injection nurse visit.  Patient denies chest pain, shortness of breath, dizziness, palpitations, mood changes or medication problems.   ROS    Objective:     BP 122/71   Pulse 75   Temp 98.4 F (36.9 C)   Ht 5\' 10"  (1.778 m)   SpO2 99%   BMI 33.72 kg/m    Physical Exam   No results found for any visits on 03/06/23.    The ASCVD Risk score (Arnett DK, et al., 2019) failed to calculate for the following reasons:   The 2019 ASCVD risk score is only valid for ages 51 to 53    Assessment & Plan:  Testosterone injection. Admin 200mg  IM Left thigh. Patient tolerated injection well without complications. Patient was just doing injections in office until his supply of testosterone available at pharmacy and states these will be available to him before next injection is due in 14 days.  Problem List Items Addressed This Visit       Endocrine   Hypogonadism in male - Primary    Return for will resume self injections of testosterone at home - due in 14 days. Elizabeth Palau, LPN

## 2023-03-06 NOTE — Patient Instructions (Signed)
Patient was just doing injections in office until his supply of testosterone available at pharmacy and states these will be available to him before next injection is due in 14 days.

## 2023-03-07 LAB — VITAMIN D 25 HYDROXY (VIT D DEFICIENCY, FRACTURES): Vit D, 25-Hydroxy: 36.1 ng/mL (ref 30.0–100.0)

## 2023-03-08 ENCOUNTER — Encounter: Payer: Self-pay | Admitting: Medical-Surgical

## 2023-03-28 ENCOUNTER — Encounter: Payer: Self-pay | Admitting: Medical-Surgical

## 2023-03-29 ENCOUNTER — Encounter: Payer: Self-pay | Admitting: Medical-Surgical

## 2023-03-29 ENCOUNTER — Ambulatory Visit: Payer: BC Managed Care – PPO | Admitting: Medical-Surgical

## 2023-03-29 VITALS — BP 126/91 | HR 77 | Resp 20 | Ht 70.0 in | Wt 233.0 lb

## 2023-03-29 DIAGNOSIS — E291 Testicular hypofunction: Secondary | ICD-10-CM

## 2023-03-29 DIAGNOSIS — N451 Epididymitis: Secondary | ICD-10-CM | POA: Diagnosis not present

## 2023-03-29 MED ORDER — SULFAMETHOXAZOLE-TRIMETHOPRIM 800-160 MG PO TABS: 1.0000 | ORAL_TABLET | Freq: Two times a day (BID) | ORAL | 0 refills | Status: DC

## 2023-03-29 MED ORDER — TESTOSTERONE CYPIONATE 200 MG/ML IM SOLN: 150.0000 mg | INTRAMUSCULAR | 3 refills | Status: DC

## 2023-03-29 NOTE — Progress Notes (Signed)
        Established patient visit  History, exam, impression, and plan:  1. Epididymitis, left Phillip Harris 37 year old male presenting today with complaints of left testicular discomfort and tenderness.  History of epididymitis with similar symptoms.  He has performed a self-exam and notes that the left testicle is slightly swollen and tender at the site of the epididymis.  No urinary symptoms or hematuria.  Does not participate in anal insertive intercourse.  No recent fevers or chills.  Exam deferred given that he is self-aware and careful to note physical changes.  Treating for epididymitis with Bactrim twice daily x 10 days.  2. Hypogonadism in male Has been completing testosterone injections at home with a dose of 200 mg every 2 weeks.  Still notes that the injection does not seem to last for the full 14 days and he has fall off of efficacy after about a week.  We had discussed switching his dosing to weekly however the pharmacy has only been providing 2 vials to complete 2 injections/month despite the new prescription that was sent.  On review of his testosterone check, he was in the middle of the therapeutic range.  He would like to try to shoot for a little higher at this point.  I did review with him the risk of blood clots with hormone replacement therapy if his levels were supratherapeutic.  He is aware of the risk and would still like to make some changes.  Switching to testosterone cypionate 0.6 mL weekly with plan to recheck labs between injections 6 and 7.  New prescription sent and pharmacy contacted to make sure that they are aware of the dose and frequency change.  Procedures performed this visit: None.  Return in 8 weeks (on 05/23/2023) for Labs to recheck testosterone.  __________________________________ Thayer Ohm, DNP, APRN, FNP-BC Primary Care and Sports Medicine [facility] [place]

## 2023-03-30 ENCOUNTER — Encounter: Payer: Self-pay | Admitting: Medical-Surgical

## 2023-03-30 DIAGNOSIS — N50812 Left testicular pain: Secondary | ICD-10-CM

## 2023-04-17 ENCOUNTER — Ambulatory Visit (INDEPENDENT_AMBULATORY_CARE_PROVIDER_SITE_OTHER): Payer: BC Managed Care – PPO

## 2023-04-17 DIAGNOSIS — N50812 Left testicular pain: Secondary | ICD-10-CM | POA: Diagnosis not present

## 2023-04-23 ENCOUNTER — Ambulatory Visit (INDEPENDENT_AMBULATORY_CARE_PROVIDER_SITE_OTHER): Payer: BC Managed Care – PPO

## 2023-04-23 DIAGNOSIS — N452 Orchitis: Secondary | ICD-10-CM

## 2023-04-23 MED ORDER — CEFTRIAXONE SODIUM 1 G IJ SOLR
1.0000 g | Freq: Once | INTRAMUSCULAR | Status: AC
Start: 2023-04-23 — End: 2023-04-23
  Administered 2023-04-23: 1 g via INTRAMUSCULAR

## 2023-04-23 NOTE — Progress Notes (Signed)
   Established Patient Office Visit  Subjective   Patient ID: Phillip Harris, male    DOB: 03-01-1986  Age: 37 y.o. MRN: [id number]  Chief Complaint  Patient presents with   Orchitis    HPI  Phillip Harris is here for Rocephin injection due to Orchitis.  ROS    Objective:     There were no vitals taken for this visit.   Physical Exam   No results found for any visits on 04/23/23.    The ASCVD Risk score (Arnett DK, et al., 2019) failed to calculate for the following reasons:   The 2019 ASCVD risk score is only valid for ages 48 to 104    Assessment & Plan:  Rocephin injection - Patient tolerated injection well without complications. Patient advised to schedule next injection 1 day from today.    Problem List Items Addressed This Visit   None Visit Diagnoses     Orchitis    -  Primary   Relevant Medications   cefTRIAXone (ROCEPHIN) injection 1 g (Completed) (Start on 04/23/2023 11:15 AM)       Return in about 1 day (around 04/24/2023) for 2 nd Rocephin injection. Earna Coder, Janalyn Harder, CMA

## 2023-04-23 NOTE — Telephone Encounter (Signed)
Patient scheduled.

## 2023-04-24 ENCOUNTER — Ambulatory Visit (INDEPENDENT_AMBULATORY_CARE_PROVIDER_SITE_OTHER): Payer: BC Managed Care – PPO

## 2023-04-24 DIAGNOSIS — N452 Orchitis: Secondary | ICD-10-CM

## 2023-04-24 MED ORDER — CEFTRIAXONE SODIUM 1 G IJ SOLR
1.0000 g | Freq: Once | INTRAMUSCULAR | Status: AC
Start: 2023-04-24 — End: 2023-04-24
  Administered 2023-04-24: 1 g via INTRAMUSCULAR

## 2023-04-24 NOTE — Progress Notes (Signed)
   Established Patient Office Visit  Subjective   Patient ID: Phillip Harris, male    DOB: [birth year]  Age: 37 y.o. MRN: [id number]  Chief Complaint  Patient presents with   Orchitis    HPI  Phillip Harris is here for 2nd Rocephin injection.   ROS    Objective:     There were no vitals taken for this visit.   Physical Exam   No results found for any visits on 04/24/23.    The ASCVD Risk score (Arnett DK, et al., 2019) failed to calculate for the following reasons:   The 2019 ASCVD risk score is only valid for ages 63 to 23    Assessment & Plan:  Rocephin injection - Patient tolerated injection well without complications. Patient advised to schedule next injection 1 day from today for the last injection.     Problem List Items Addressed This Visit   None Visit Diagnoses     Orchitis    -  Primary   Relevant Medications   cefTRIAXone (ROCEPHIN) injection 1 g (Completed)       Return in about 1 day (around 04/25/2023) for Rocephin injection. Earna Coder, Janalyn Harder, CMA

## 2023-04-25 ENCOUNTER — Ambulatory Visit (INDEPENDENT_AMBULATORY_CARE_PROVIDER_SITE_OTHER): Payer: BC Managed Care – PPO | Admitting: Medical-Surgical

## 2023-04-25 DIAGNOSIS — N452 Orchitis: Secondary | ICD-10-CM | POA: Diagnosis not present

## 2023-04-25 MED ORDER — CEFTRIAXONE SODIUM 1 G IJ SOLR
1.0000 g | Freq: Once | INTRAMUSCULAR | Status: AC
Start: 2023-04-25 — End: 2023-04-25
  Administered 2023-04-25: 1 g via INTRAMUSCULAR

## 2023-04-25 NOTE — Progress Notes (Unsigned)
   Subjective:    Patient ID: Phillip Harris, male    DOB: 11/30/1985, 37 y.o.   MRN: [id number]  HPI Patient is here for 3 rd Rochephin injection.  Location: [facility]  Review of Systems     Objective:   Physical Exam        Assessment & Plan:   Patient tolerated injection well without complications.

## 2023-04-26 NOTE — Progress Notes (Signed)
Medical screening examination/treatment was performed by qualified clinical staff member and as supervising provider I was immediately available for consultation/collaboration. I have reviewed documentation and agree with assessment and plan. ° °Opie Maclaughlin L. Kreston Ahrendt, DNP, APRN, FNP-BC °[facility] °Primary Care and Sports Medicine ° °

## 2023-04-27 NOTE — Addendum Note (Signed)
Addended byChristen Butter on: 04/27/2023 08:08 AM   Modules accepted: Orders

## 2023-04-30 ENCOUNTER — Ambulatory Visit: Payer: BC Managed Care – PPO | Admitting: Medical-Surgical

## 2023-05-02 ENCOUNTER — Telehealth: Payer: Self-pay

## 2023-05-02 NOTE — Telephone Encounter (Signed)
Patient's appointment rescheduled from 11/27 to 11/22, patient would like to know if he's ok to come in on 11/22 due to him needing T shot in a certain timeframe, please advise, thanks.

## 2023-05-02 NOTE — Telephone Encounter (Signed)
He has changed to doing the Testosterone injections weekly at home

## 2023-05-15 ENCOUNTER — Encounter: Payer: Self-pay | Admitting: Urology

## 2023-05-15 ENCOUNTER — Ambulatory Visit: Payer: BC Managed Care – PPO | Admitting: Urology

## 2023-05-15 VITALS — BP 125/78 | HR 67 | Ht 70.0 in | Wt 230.0 lb

## 2023-05-15 DIAGNOSIS — N50819 Testicular pain, unspecified: Secondary | ICD-10-CM | POA: Diagnosis not present

## 2023-05-15 NOTE — Progress Notes (Signed)
Assessment: 1. Pain in testicle, unspecified laterality      Plan: Today I had a low long discussion with the patient regarding his chronic testicular pain.  At this time I have recommended conservative measures including wearing athletic scrotal support especially during any activity or while at work.  Would also recommend hot sitz bath's as needed.  If this continues to be an issue for him I have recommend that he see Dr.Machen at [facility] for consideration of additional therapy  Chief Complaint: left testis pain  History of Present Illness:  Phillip Harris is a 37 y.o. male who is seen in consultation from Christen Butter, NP for evaluation of left testicular discomfort.  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  This is a chronic problem and has recurred previously.  He last had an episode in around 2016 and 18 that ultimately went away.  He has been treated with Doxy in the past.  He recently completed a course of Doxy which did not help as well as Rocephin given 1 g for 3 days.  He states that his discomfort is not really a pain but a nagging tugging sensation with his testicle trying to pull up into his groin region.  His exam is unremarkable.  Of note, patient was started on testosterone replacement therapy a few months ago.  He had hypogonadal symptoms and a testosterone of 223.  Scrotal ultrasound 04/17/2023-- CLINICAL DATA:  Left testicular discomfort for 1 month.   EXAM: SCROTAL ULTRASOUND   DOPPLER ULTRASOUND OF THE TESTICLES   TECHNIQUE: Complete ultrasound examination of the testicles, epididymis, and other scrotal structures was performed. Color and spectral Doppler ultrasound were also utilized to evaluate blood flow to the testicles.   COMPARISON:  Scrotal ultrasound dated 11/11/2009.   FINDINGS: Right testicle   Measurements: 4.2 x 1.7 x 2.5 cm. No mass or  microlithiasis visualized.   Left testicle   Measurements: 4.1 x 1.7 x 2.6 cm. The left testicle is mildly hyperemic relative to the right. No mass or microlithiasis visualized. A left scrotal pearl measures 0.4 cm.   Right epididymis:  Normal in size and appearance.   Left epididymis:  Normal in size and appearance.   Hydrocele:  There is a small left hydrocele.   Varicocele:  None visualized.   Pulsed Doppler interrogation of both testes demonstrates normal low resistance arterial and venous waveforms bilaterally.   IMPRESSION: 1. Mild hyperemia of the left testicle relative to the right may reflect orchitis. No evidence of epididymitis. 2. Small left hydrocele.   Past Medical History:  Past Medical History:  Diagnosis Date   ADHD (attention deficit hyperactivity disorder)    Dr. [facility]   Eosinophilic esophagitis 2007    Past Surgical History:  Past Surgical History:  Procedure Laterality Date   ESOPHAGOGASTRODUODENOSCOPY  2007   With [facility] dilation question EOE   EYE SURGERY Bilateral 2012   Laser Eye Surgery    FRACTURE SURGERY     Left forearm Surgery  2004   REFRACTIVE SURGERY  2012   Right Wrist Surgery  2009    Allergies:  No Known Allergies  Family History:  Family History  Problem Relation Age of Onset   Diabetes Mother    Gastric cancer Mother        gastroesophageal junction cancer   Hypertension Mother    Hyperlipidemia Mother    Hypothyroidism Mother    Diabetes  Father    Hyperlipidemia Father    Hypertension Father    Prostate cancer Neg Hx    Colon cancer Neg Hx    CAD Neg Hx    Liver disease Neg Hx     Social History:  Social History   Tobacco Use   Smoking status: Never   Smokeless tobacco: Never  Vaping Use   Vaping status: Never Used  Substance Use Topics   Alcohol use: Yes    Alcohol/week: 0.0 standard drinks of alcohol    Comment: occasionally   Drug use: No    Review of symptoms:  Constitutional:  Negative  for unexplained weight loss, night sweats, fever, chills ENT:  Negative for nose bleeds, sinus pain, painful swallowing CV:  Negative for chest pain, shortness of breath, exercise intolerance, palpitations, loss of consciousness Resp:  Negative for cough, wheezing, shortness of breath GI:  Negative for nausea, vomiting, diarrhea, bloody stools GU:  Positives noted in HPI; otherwise negative for gross hematuria, dysuria, urinary incontinence Neuro:  Negative for seizures, poor balance, limb weakness, slurred speech Psych:  Negative for lack of energy, depression, anxiety Endocrine:  Negative for polydipsia, polyuria, symptoms of hypoglycemia (dizziness, hunger, sweating) Hematologic:  Negative for anemia, purpura, petechia, prolonged or excessive bleeding, use of anticoagulants  Allergic:  Negative for difficulty breathing or choking as a result of exposure to anything; no shellfish allergy; no allergic response (rash/itch) to materials, foods  Physical exam: BP 125/78   Pulse 67   Ht 5\' 10"  (1.778 m)   Wt 230 lb (104.3 kg)   BMI 33.00 kg/m  GENERAL APPEARANCE:  Well appearing, well developed, well nourished, NAD  GU: Normal uncircumcised phallus.  Normal right testis and cord without hernia Left testis palpable doubly normal.  There is a small 0.5 cm firm area consistent with a scrotal pearl near the epididymis.  The remainder of the cord is normal and there is no evidence of hernia.  Results: Ua clear

## 2023-05-16 LAB — URINALYSIS, ROUTINE W REFLEX MICROSCOPIC
Bilirubin, UA: NEGATIVE
Glucose, UA: NEGATIVE
Ketones, UA: NEGATIVE
Leukocytes,UA: NEGATIVE
Nitrite, UA: NEGATIVE
Protein,UA: NEGATIVE
RBC, UA: NEGATIVE
Specific Gravity, UA: 1.02 (ref 1.005–1.030)
Urobilinogen, Ur: 1 mg/dL (ref 0.2–1.0)
pH, UA: 8 — ABNORMAL HIGH (ref 5.0–7.5)

## 2023-05-17 ENCOUNTER — Other Ambulatory Visit: Payer: Self-pay | Admitting: Medical-Surgical

## 2023-05-17 MED ORDER — METHYLPHENIDATE HCL ER (OSM) 18 MG PO TBCR: 18.0000 mg | EXTENDED_RELEASE_TABLET | ORAL | 0 refills | Status: DC

## 2023-05-18 ENCOUNTER — Ambulatory Visit: Payer: BC Managed Care – PPO | Admitting: Medical-Surgical

## 2023-05-18 ENCOUNTER — Encounter: Payer: Self-pay | Admitting: Medical-Surgical

## 2023-05-18 VITALS — BP 118/70 | HR 84 | Resp 20 | Ht 70.0 in | Wt 230.0 lb

## 2023-05-18 DIAGNOSIS — E291 Testicular hypofunction: Secondary | ICD-10-CM | POA: Diagnosis not present

## 2023-05-18 DIAGNOSIS — F909 Attention-deficit hyperactivity disorder, unspecified type: Secondary | ICD-10-CM | POA: Diagnosis not present

## 2023-05-18 MED ORDER — METHYLPHENIDATE HCL ER (OSM) 18 MG PO TBCR: 18.0000 mg | EXTENDED_RELEASE_TABLET | ORAL | 0 refills | Status: DC

## 2023-05-18 NOTE — Progress Notes (Signed)
        Established patient visit  History, exam, impression, and plan:  1. Attention deficit hyperactivity disorder (ADHD), unspecified ADHD type Pleasant 37 year old gentleman presenting today for follow-up on ADHD.  Has been taking Concerta 18 mg daily for the past 3 months, tolerating very well with minimal side effects.  Occasionally has dry mouth but it is much better than at the higher doses.  Feels that the medication is working well and has no issues with sleep disturbance, appetite suppression, or weight fluctuations.  Plan to continue Concerta 18 mg daily and follow-up in 3 months.  2. Hypogonadism in male Has been doing testosterone injections at home once weekly.  Tolerating well without side effects.  Had labs drawn before his appointment today which will be a trough testosterone level.  Ideally will have repeat testosterone labs drawn after another 3-4 weeks on the middle day to give [place] an accurate reading.  No concerns for blood clots. Blood pressure normal. Continue weekly testosterone as prescribed.  Procedures performed this visit: None.  Return in about 3 months (around 08/18/2023) for ADHD follow up.  __________________________________ Thayer Ohm, DNP, APRN, FNP-BC Primary Care and Sports Medicine [facility] [place]

## 2023-05-19 LAB — CBC
Hematocrit: 48.9 % (ref 37.5–51.0)
Hemoglobin: 15.4 g/dL (ref 13.0–17.7)
MCH: 26.7 pg (ref 26.6–33.0)
MCHC: 31.5 g/dL (ref 31.5–35.7)
MCV: 85 fL (ref 79–97)
Platelets: 192 10*3/uL (ref 150–450)
RBC: 5.77 x10E6/uL (ref 4.14–5.80)
RDW: 13.3 % (ref 11.6–15.4)
WBC: 5.8 10*3/uL (ref 3.4–10.8)

## 2023-05-19 LAB — TESTOSTERONE: Testosterone: 509 ng/dL (ref 264–916)

## 2023-05-23 ENCOUNTER — Ambulatory Visit: Payer: BC Managed Care – PPO | Admitting: Medical-Surgical

## 2023-06-12 ENCOUNTER — Other Ambulatory Visit: Payer: Self-pay | Admitting: Medical-Surgical

## 2023-06-12 ENCOUNTER — Ambulatory Visit: Payer: BC Managed Care – PPO | Admitting: Medical-Surgical

## 2023-06-12 DIAGNOSIS — E291 Testicular hypofunction: Secondary | ICD-10-CM

## 2023-06-13 LAB — TESTOSTERONE: Testosterone: 690 ng/dL (ref 264–916)

## 2023-06-22 ENCOUNTER — Encounter: Payer: Self-pay | Admitting: Medical-Surgical

## 2023-06-22 MED ORDER — ESOMEPRAZOLE MAGNESIUM 40 MG PO CPDR: 40.0000 mg | DELAYED_RELEASE_CAPSULE | Freq: Every morning | ORAL | 1 refills | Status: DC

## 2023-06-22 NOTE — Telephone Encounter (Signed)
Requesting rx rf of esomeprazole Last written 09/14/2022 Last OV 05/18/2023 No upcoming appt schld.

## 2023-08-09 ENCOUNTER — Other Ambulatory Visit: Payer: Self-pay

## 2023-08-09 ENCOUNTER — Encounter: Payer: Self-pay | Admitting: Medical-Surgical

## 2023-08-09 ENCOUNTER — Other Ambulatory Visit: Payer: Self-pay | Admitting: Medical-Surgical

## 2023-08-09 MED ORDER — TESTOSTERONE CYPIONATE 200 MG/ML IM SOLN: 150.0000 mg | INTRAMUSCULAR | 3 refills | Status: DC

## 2023-09-17 ENCOUNTER — Encounter: Payer: Self-pay | Admitting: Medical-Surgical

## 2023-10-15 ENCOUNTER — Encounter: Payer: Self-pay | Admitting: Medical-Surgical

## 2023-10-17 NOTE — Telephone Encounter (Signed)
 Spoke to Phillip Harris - she checked [facility] [employer] and no order showing for patient .  She states she will need the order for the home sleep study in place and routed by her  before patient calls [facility]. [facility] phone # [phone] ( toll free) or [phone].

## 2023-10-17 NOTE — Telephone Encounter (Signed)
Pended order for referral

## 2023-11-06 ENCOUNTER — Encounter: Payer: Self-pay | Admitting: Medical-Surgical

## 2023-11-08 ENCOUNTER — Ambulatory Visit: Admitting: Medical-Surgical

## 2023-11-08 ENCOUNTER — Encounter: Payer: Self-pay | Admitting: Medical-Surgical

## 2023-11-08 VITALS — BP 114/76 | HR 67 | Resp 20 | Ht 70.0 in | Wt 228.5 lb

## 2023-11-08 DIAGNOSIS — G4733 Obstructive sleep apnea (adult) (pediatric): Secondary | ICD-10-CM

## 2023-11-08 DIAGNOSIS — E291 Testicular hypofunction: Secondary | ICD-10-CM | POA: Diagnosis not present

## 2023-11-08 MED ORDER — AMBULATORY NON FORMULARY MEDICATION: 0 refills | Status: AC

## 2023-11-08 NOTE — Progress Notes (Signed)
        Established patient visit  History, exam, impression, and plan:  1. Severe obstructive sleep apnea in adult (Primary) Pleasant 37 year old male presenting today to review his recent sleep study results. Had been having significant fatigue, difficulty sleeping, daytime sleepiness, loud snoring, and witnessed apnea. Sleep study done at home confirmed severe sleep apnea with an AHI of 61.6 and oxygen desaturations as low as 78%. Most of his apnea episodes were obstructive but there were 13 that were central/mixed. Discussed recommendations for treatment including weight management, avoiding sedating medications/substances, mouth guards, elevating the head of the bed, CPAP, and surgical options such as Inspire. Plan to start with the CPAP along with conservative measures. Sending the prescription with the office note and sleep study report to [facility]. Discussed expectations going forward and the need for a dedicated follow up between 31-90 days after starting the CPAP. Reviewed compliance recommendations. If he has not heard from anyone in the next 2 weeks, advised to reach out and let me know.  - AMBULATORY NON FORMULARY MEDICATION; Continuous positive airway pressure (CPAP) machine set on AutoPAP (4-20 cmH2O), with all supplemental supplies as needed.  Dispense: 1 each; Refill: 0  2. Hypogonadism in male Has been doing self-injections of testosterone  at home weekly for about over 6 months. Taking 150mg  weekly, tolerating well without side effects. Feels that the HRT is helping with his overall health and wellbeing. It helped a little with the fatigue but as above, that remains an issue. Due for recheck of levels. Does his shot on Fridays so orders placed with instructions to have the labs done on Monday or Tuesday of next week. Order form given to patient.  - CBC - Testosterone   Procedures performed this visit: None.  Return for CPAP follow up between day  31-90.  __________________________________ Maryl Snook, DNP, APRN, FNP-BC Primary Care and Sports Medicine [facility] [place]

## 2023-11-08 NOTE — Telephone Encounter (Signed)
 Patient scheduled.

## 2023-11-13 ENCOUNTER — Ambulatory Visit: Payer: Self-pay | Admitting: Medical-Surgical

## 2023-11-13 DIAGNOSIS — E291 Testicular hypofunction: Secondary | ICD-10-CM

## 2023-11-13 LAB — CBC
Hematocrit: 49.4 % (ref 37.5–51.0)
Hemoglobin: 16 g/dL (ref 13.0–17.7)
MCH: 28.7 pg (ref 26.6–33.0)
MCHC: 32.4 g/dL (ref 31.5–35.7)
MCV: 89 fL (ref 79–97)
Platelets: 185 10*3/uL (ref 150–450)
RBC: 5.57 x10E6/uL (ref 4.14–5.80)
RDW: 14 % (ref 11.6–15.4)
WBC: 8.3 10*3/uL (ref 3.4–10.8)

## 2023-11-13 LAB — TESTOSTERONE: Testosterone: 1063 ng/dL — ABNORMAL HIGH (ref 264–916)

## 2023-11-13 MED ORDER — TESTOSTERONE CYPIONATE 200 MG/ML IM SOLN: 120.0000 mg | INTRAMUSCULAR | 3 refills | Status: DC

## 2023-12-05 ENCOUNTER — Encounter: Payer: Self-pay | Admitting: Medical-Surgical

## 2023-12-05 ENCOUNTER — Other Ambulatory Visit (INDEPENDENT_AMBULATORY_CARE_PROVIDER_SITE_OTHER): Admitting: Medical-Surgical

## 2023-12-05 DIAGNOSIS — F909 Attention-deficit hyperactivity disorder, unspecified type: Secondary | ICD-10-CM

## 2023-12-05 MED ORDER — DEXMETHYLPHENIDATE HCL ER 5 MG PO CP24
5.0000 mg | ORAL_CAPSULE | Freq: Every day | ORAL | 0 refills | Status: DC
Start: 2023-12-05 — End: 2024-01-02

## 2023-12-05 NOTE — Progress Notes (Signed)
 Patient presenting today in office. Would like to restart ADHD meds but the Concerta  18mg  caused dry mouth and difficulty sleeping. He would like to try something different. Discussed options and will plan to start Focalin XR 5mg  daily. Follow up already scheduled in about 1 month.   ___________________________________________ Maryl Snook, DNP, APRN, FNP-BC Primary Care and Sports Medicine [facility] [place]

## 2023-12-26 LAB — TESTOSTERONE: Testosterone: 918 ng/dL — ABNORMAL HIGH (ref 264–916)

## 2024-01-02 ENCOUNTER — Encounter: Payer: Self-pay | Admitting: Medical-Surgical

## 2024-01-02 ENCOUNTER — Ambulatory Visit: Admitting: Medical-Surgical

## 2024-01-02 VITALS — BP 132/78 | HR 67 | Resp 20 | Ht 70.0 in | Wt 237.7 lb

## 2024-01-02 DIAGNOSIS — E291 Testicular hypofunction: Secondary | ICD-10-CM | POA: Diagnosis not present

## 2024-01-02 DIAGNOSIS — G4733 Obstructive sleep apnea (adult) (pediatric): Secondary | ICD-10-CM | POA: Insufficient documentation

## 2024-01-02 DIAGNOSIS — F909 Attention-deficit hyperactivity disorder, unspecified type: Secondary | ICD-10-CM | POA: Diagnosis not present

## 2024-01-02 MED ORDER — DEXMETHYLPHENIDATE HCL ER 5 MG PO CP24: 5.0000 mg | ORAL_CAPSULE | Freq: Every day | ORAL | 0 refills | Status: DC

## 2024-01-02 MED ORDER — DEXMETHYLPHENIDATE HCL ER 5 MG PO CP24
5.0000 mg | ORAL_CAPSULE | Freq: Every day | ORAL | 0 refills | Status: DC
Start: 2024-02-03 — End: 2024-04-09

## 2024-01-02 MED ORDER — TESTOSTERONE CYPIONATE 200 MG/ML IM SOLN: 120.0000 mg | INTRAMUSCULAR | 3 refills | Status: DC

## 2024-01-02 NOTE — Progress Notes (Signed)
        Established patient visit  History, exam, impression, and plan:  1. Severe obstructive sleep apnea in adult (Primary) Very pleasant 38 year old male presenting today for follow-up on severe sleep apnea.  He has been able to obtain the CPAP that was prescribed and is working with his DME company on finding a mask that fits appropriately.  He is currently using a fullface mask but reports that he does not breathe through his mouth when sleeping so this is not ideal.  He tried a nasal mask however that caused significant irritation in the nasal passages.  He notes that the irritation is still there in his nasal mucosa feels raw.  He has been wearing his CPAP 7 to 9 hours a night and is showing 100% compliance on the my air app.  No need for additional supplies at this time.  Reports that that he is tolerating it very well and wakes feeling refreshed when he did not before.  He would like to see if there is another mask that would be more beneficial but has not been able to get an appointment with the [employer] [facility] due to his work schedule.  Advised that we will look around and see if we have a different option that may work for him so that we can order it on their website.    2. Hypogonadism in male Recent testosterone  level at the upper end of normal.  He is injecting testosterone  0.6 mL weekly, tolerating well without side effects.  Reports that he feels good and has more energy.  Reviewed the risk of blood clots with exogenous hormones.  Patient verbalized understanding and is agreeable.  Plan to continue testosterone  as prescribed.  Will check in 3 months at his next follow-up.  3. Attention deficit hyperactivity disorder (ADHD), unspecified ADHD type Has been taking Focalin  XR 5 mg daily, tolerating well without side effects.  Notes that there is no dry mouth like with other medications and the medication is working well with focus.  Hesitant to consider increasing his dose since he feels  like he is at a good point.  No palpitations, chest pain, sleep disturbance, appetite suppression, or weight fluctuations.  Continue Focalin  XR 5 mg daily as prescribed.  Procedures performed this visit: None.  Return in about 3 months (around 04/03/2024) for ADHD/testosterone /OSA follow up with labs.  __________________________________ Phillip FREDRIK Palin, DNP, APRN, FNP-BC Primary Care and Sports Medicine [facility] [place]

## 2024-02-14 ENCOUNTER — Encounter: Payer: Self-pay | Admitting: Medical-Surgical

## 2024-03-22 ENCOUNTER — Other Ambulatory Visit: Payer: Self-pay | Admitting: Medical-Surgical

## 2024-04-09 ENCOUNTER — Ambulatory Visit: Admitting: Medical-Surgical

## 2024-04-09 VITALS — BP 135/80 | HR 83 | Resp 20 | Ht 70.0 in | Wt 232.0 lb

## 2024-04-09 DIAGNOSIS — E291 Testicular hypofunction: Secondary | ICD-10-CM | POA: Diagnosis not present

## 2024-04-09 DIAGNOSIS — E661 Drug-induced obesity: Secondary | ICD-10-CM | POA: Diagnosis not present

## 2024-04-09 DIAGNOSIS — G4733 Obstructive sleep apnea (adult) (pediatric): Secondary | ICD-10-CM | POA: Diagnosis not present

## 2024-04-09 DIAGNOSIS — Z6833 Body mass index (BMI) 33.0-33.9, adult: Secondary | ICD-10-CM

## 2024-04-09 DIAGNOSIS — F909 Attention-deficit hyperactivity disorder, unspecified type: Secondary | ICD-10-CM

## 2024-04-09 DIAGNOSIS — E66811 Obesity, class 1: Secondary | ICD-10-CM | POA: Diagnosis not present

## 2024-04-09 DIAGNOSIS — E785 Hyperlipidemia, unspecified: Secondary | ICD-10-CM

## 2024-04-09 LAB — POCT GLYCOSYLATED HEMOGLOBIN (HGB A1C): Hemoglobin A1C: 5.5 % (ref 4.0–5.6)

## 2024-04-09 MED ORDER — ZEPBOUND 2.5 MG/0.5ML ~~LOC~~ SOAJ: 2.5000 mg | SUBCUTANEOUS | 0 refills | Status: DC

## 2024-04-09 MED ORDER — TESTOSTERONE CYPIONATE 200 MG/ML IM SOLN: 120.0000 mg | INTRAMUSCULAR | 3 refills | Status: DC

## 2024-04-09 MED ORDER — DEXMETHYLPHENIDATE HCL ER 10 MG PO CP24: 10.0000 mg | ORAL_CAPSULE | Freq: Every day | ORAL | 0 refills | Status: DC

## 2024-04-09 NOTE — Assessment & Plan Note (Signed)
 No current medication for management of dyslipidemia.  Room for improvement in dietary habits and interested in weight loss. - Checking lipids and CMP today.

## 2024-04-09 NOTE — Assessment & Plan Note (Signed)
 Stable on weekly testosterone  injections at 0.7 mL.  No significant concerns.  Due for recheck of labs. - Checking testosterone  today. - Continue testosterone  0.7 mL weekly injections, dose titration depending on results.

## 2024-04-09 NOTE — Assessment & Plan Note (Addendum)
 Doing well with good compliance with CPAP use.  Finds that this is helpful and denies need for supplies or any current concerns. - Work on weight loss efforts. - Continue CPAP use nightly. - Avoid sedating medications/alcohol. - Will try for Zepbound 2.5mg  weekly for treatment of Severe OSA.

## 2024-04-09 NOTE — Progress Notes (Signed)
 Established patient visit   History of Present Illness   Discussed the use of AI scribe software for clinical note transcription with the patient, who gave verbal consent to proceed.  History of Present Illness   Phillip Harris is a 38 year old male who presents for a follow-up regarding his medication management.  Attention deficit hyperactivity disorder symptoms and stimulant medication effects - Takes Focalin  XR 5 mg in the morning for ADHD - Experiences afternoon fatigue around 2 or 3 PM - Uses an energy drink in the afternoon to maintain energy - Concerned about increasing Focalin  dose due to potential side effects, including dry mouth - Occasionally experiences increased blood pressure, facial flushing, and sensation of being hot  Testosterone  replacement therapy - Receives testosterone  injections at a dose of 0.7ml weekly - Last injection administered on Saturday night - Occasionally delays injections due to a busy schedule - Testosterone  therapy is effective  Psychosocial stressors and weight management - Currently enrolled in college while working full-time, resulting in significant stress and a busy schedule - Has been exercising more but also notes increased appetite and food consumption - Currently taking a math course and managing additional classes and work responsibilities  Severe OSA - Currently using a CPAP, compliant - Feels that the CPAP use is helpful and denies any current concerns    Physical Exam   Physical Exam Vitals reviewed.  Constitutional:      General: He is not in acute distress.    Appearance: Normal appearance. He is not ill-appearing.  HENT:     Head: Normocephalic and atraumatic.  Cardiovascular:     Rate and Rhythm: Normal rate and regular rhythm.     Pulses: Normal pulses.     Heart sounds: Normal heart sounds. No murmur heard.    No friction rub. No gallop.  Pulmonary:     Effort: Pulmonary effort is normal.  No respiratory distress.     Breath sounds: Normal breath sounds.  Skin:    General: Skin is warm and dry.  Neurological:     Mental Status: He is alert and oriented to person, place, and time.  Psychiatric:        Mood and Affect: Mood normal.        Behavior: Behavior normal.        Thought Content: Thought content normal.        Judgment: Judgment normal.    Assessment & Plan   Problem List Items Addressed This Visit       Respiratory   Severe obstructive sleep apnea in adult   Doing well with good compliance with CPAP use.  Finds that this is helpful and denies need for supplies or any current concerns. - Work on weight loss efforts. - Continue CPAP use nightly. - Avoid sedating medications/alcohol. - Will try for Zepbound 2.5mg  weekly for treatment of Severe OSA.      Relevant Medications   tirzepatide (ZEPBOUND) 2.5 MG/0.5ML Pen   Other Relevant Orders   CBC     Endocrine   Hypogonadism in male   Stable on weekly testosterone  injections at 0.7 mL.  No significant concerns.  Due for recheck of labs. - Checking testosterone  today. - Continue testosterone  0.7 mL weekly injections, dose titration depending on results.      Relevant Orders   CBC   Testosterone      Other   Attention deficit hyperactivity disorder - Primary   Focalin  XR  5 mg effective but duration insufficient. Considering dose adjustment to address early wear-off and manage side effects. - Increase Focalin  XR to 10 mg in the morning, monitor side effects. - Revert to 5 mg if side effects occur. - Consider afternoon instant release dose if needed.      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   Room for improvement in dietary habits and interested in weight loss. - Checking lipids and CMP today.        Relevant Orders   CMP14+EGFR   Lipid panel   Follow up   Return in about 3 months (around 07/10/2024) for ADHD follow up. __________________________________ [name], DNP, APRN, FNP-BC Primary Care and Sports Medicine [facility] [place]

## 2024-04-09 NOTE — Assessment & Plan Note (Signed)
 Focalin  XR 5 mg effective but duration insufficient. Considering dose adjustment to address early wear-off and manage side effects. - Increase Focalin  XR to 10 mg in the morning, monitor side effects. - Revert to 5 mg if side effects occur. - Consider afternoon instant release dose if needed.

## 2024-04-10 ENCOUNTER — Ambulatory Visit: Payer: Self-pay | Admitting: Medical-Surgical

## 2024-04-10 LAB — CBC
Hematocrit: 49.4 % (ref 37.5–51.0)
Hemoglobin: 16.3 g/dL (ref 13.0–17.7)
MCH: 28.7 pg (ref 26.6–33.0)
MCHC: 33 g/dL (ref 31.5–35.7)
MCV: 87 fL (ref 79–97)
Platelets: 178 x10E3/uL (ref 150–450)
RBC: 5.67 x10E6/uL (ref 4.14–5.80)
RDW: 12.6 % (ref 11.6–15.4)
WBC: 6.3 x10E3/uL (ref 3.4–10.8)

## 2024-04-10 LAB — CMP14+EGFR
ALT: 23 IU/L (ref 0–44)
AST: 20 IU/L (ref 0–40)
Albumin: 4.7 g/dL (ref 4.1–5.1)
Alkaline Phosphatase: 49 IU/L (ref 47–123)
BUN/Creatinine Ratio: 10 (ref 9–20)
BUN: 12 mg/dL (ref 6–20)
Bilirubin Total: 0.6 mg/dL (ref 0.0–1.2)
CO2: 25 mmol/L (ref 20–29)
Calcium: 9.6 mg/dL (ref 8.7–10.2)
Chloride: 101 mmol/L (ref 96–106)
Creatinine, Ser: 1.16 mg/dL (ref 0.76–1.27)
Globulin, Total: 2.4 g/dL (ref 1.5–4.5)
Glucose: 90 mg/dL (ref 70–99)
Potassium: 4.4 mmol/L (ref 3.5–5.2)
Sodium: 139 mmol/L (ref 134–144)
Total Protein: 7.1 g/dL (ref 6.0–8.5)
eGFR: 83 mL/min/1.73 (ref >59)

## 2024-04-10 LAB — LIPID PANEL
Chol/HDL Ratio: 4.3 ratio (ref 0.0–5.0)
Cholesterol, Total: 171 mg/dL (ref 100–199)
HDL: 40 mg/dL (ref >39)
LDL Chol Calc (NIH): 106 mg/dL — ABNORMAL HIGH (ref 0–99)
Triglycerides: 141 mg/dL (ref 0–149)
VLDL Cholesterol Cal: 25 mg/dL (ref 5–40)

## 2024-04-10 LAB — TESTOSTERONE: Testosterone: 713 ng/dL (ref 264–916)

## 2024-04-11 ENCOUNTER — Encounter: Payer: Self-pay | Admitting: Medical-Surgical

## 2024-04-15 ENCOUNTER — Encounter: Payer: Self-pay | Admitting: Medical-Surgical

## 2024-04-15 ENCOUNTER — Ambulatory Visit (INDEPENDENT_AMBULATORY_CARE_PROVIDER_SITE_OTHER): Admitting: Medical-Surgical

## 2024-04-15 DIAGNOSIS — M545 Low back pain, unspecified: Secondary | ICD-10-CM | POA: Diagnosis not present

## 2024-04-15 DIAGNOSIS — F909 Attention-deficit hyperactivity disorder, unspecified type: Secondary | ICD-10-CM | POA: Diagnosis not present

## 2024-04-15 MED ORDER — KETOROLAC TROMETHAMINE 60 MG/2ML IM SOLN: 60.0000 mg | Freq: Once | INTRAMUSCULAR | Status: DC

## 2024-04-15 MED ORDER — KETOROLAC TROMETHAMINE 60 MG/2ML IM SOLN
60.0000 mg | Freq: Once | INTRAMUSCULAR | Status: AC
  Administered 2024-04-15: 60 mg via INTRAMUSCULAR

## 2024-04-15 MED ORDER — METHYLPREDNISOLONE ACETATE 80 MG/ML IJ SUSP
80.0000 mg | Freq: Once | INTRAMUSCULAR | Status: AC
  Administered 2024-04-15: 80 mg via INTRAMUSCULAR

## 2024-04-15 NOTE — Addendum Note (Signed)
 Addended by: FANNY NIELS CROME on: 04/15/2024 04:59 PM   Modules accepted: Orders

## 2024-04-15 NOTE — Progress Notes (Signed)
 Patient presented to the office with concerns of an injury that occurred yesterday leading to severe pain in the left lower back.  Having difficulty with sitting/standing for prolonged periods.  Home management has not provided any relief.  After discussion of symptoms, treating with Toradol 60 and Depo-Medrol 80 IM in the office.  Continue conservative measures and OTC analgesics as needed.  ___________________________________________ [name], DNP, APRN, FNP-BC Primary Care and Sports Medicine [facility]

## 2024-04-16 ENCOUNTER — Encounter: Payer: Self-pay | Admitting: Medical-Surgical

## 2024-04-17 ENCOUNTER — Encounter: Payer: Self-pay | Admitting: Medical-Surgical

## 2024-04-17 ENCOUNTER — Other Ambulatory Visit: Payer: Self-pay | Admitting: Medical-Surgical

## 2024-04-17 MED ORDER — PREDNISONE 50 MG PO TABS: 50.0000 mg | ORAL_TABLET | Freq: Every day | ORAL | 0 refills | Status: DC

## 2024-04-17 MED ORDER — CYCLOBENZAPRINE HCL 10 MG PO TABS: 5.0000 mg | ORAL_TABLET | Freq: Three times a day (TID) | ORAL | 0 refills | Status: DC | PRN

## 2024-04-17 MED ORDER — HYDROCODONE-ACETAMINOPHEN 5-325 MG PO TABS: 1.0000 | ORAL_TABLET | Freq: Three times a day (TID) | ORAL | 0 refills | Status: DC | PRN

## 2024-04-17 NOTE — Progress Notes (Signed)
 Patient in 2 days ago with reports of significant low back pain mostly on the left. He received Depo-Medrol 80mg  and Toradol 60mg  IM in office. That provided some relief of symptoms however he reports that his pain has now worsened. During the day,  he experiences a dull ache but in the mornings after waking the pain is severe and limits mobility. Movement seems to help a bit. No red flag symptoms. Suspect lumbar muscle strain vs SI joint inflammation. Treating with Prednisone 50mg  daily for 5 days followed by Meloxicam 15mg  daily for 2 weeks then daily as needed. Adding Flexeril 5-10mg  TID prn. Short prescription of low dose Norco to use for severe pain as needed given his physically demanding job.   ___________________________________________ [name], DNP, APRN, FNP-BC Primary Care and Sports Medicine [facility] [place]

## 2024-04-25 ENCOUNTER — Encounter: Payer: Self-pay | Admitting: Medical-Surgical

## 2024-04-25 MED ORDER — DEXMETHYLPHENIDATE HCL ER 5 MG PO CP24: 5.0000 mg | ORAL_CAPSULE | Freq: Every day | ORAL | 0 refills | Status: DC

## 2024-05-13 ENCOUNTER — Other Ambulatory Visit: Payer: Self-pay | Admitting: Medical-Surgical

## 2024-05-13 DIAGNOSIS — B9689 Other specified bacterial agents as the cause of diseases classified elsewhere: Secondary | ICD-10-CM

## 2024-05-13 MED ORDER — JATENZO 237 MG PO CAPS: 1.0000 | ORAL_CAPSULE | Freq: Two times a day (BID) | ORAL | 0 refills | Status: DC

## 2024-05-13 MED ORDER — CEFDINIR 300 MG PO CAPS: 300.0000 mg | ORAL_CAPSULE | Freq: Two times a day (BID) | ORAL | 0 refills | Status: DC

## 2024-05-13 NOTE — Progress Notes (Signed)
 Prior discussion regarding oral testosterone  replacement however further review needed to be done to answer all questions. After review of the available resources, patient questions answered and he is interested in seeing if the oral option would be covered by his insurance. Has been symptomatic for several days before his next injection despite weekly dosing. Sending in Jatenzo to start at 237mcg twice daily. If covered, will plan to check testosterone  7 days after starting it, 4-6 hours after the morning dose.   Also, patient reports having URI symptoms for approximately one week. Symptoms improved for a couple of days but he woke this morning feeling worse and now has thick green mucus. Adding Omnicef 300mg  BID x 7 days. Ok to use OTC medications for symptom management.   ___________________________________________ [name], DNP, APRN, FNP-BC Primary Care and Sports Medicine [facility] [place]

## 2024-07-03 ENCOUNTER — Encounter: Payer: Self-pay | Admitting: Medical-Surgical

## 2024-07-03 ENCOUNTER — Ambulatory Visit: Admitting: Medical-Surgical

## 2024-07-03 VITALS — BP 136/89 | HR 81 | Temp 98.8°F | Resp 16 | Ht 70.0 in

## 2024-07-03 DIAGNOSIS — F909 Attention-deficit hyperactivity disorder, unspecified type: Secondary | ICD-10-CM

## 2024-07-03 DIAGNOSIS — I158 Other secondary hypertension: Secondary | ICD-10-CM

## 2024-07-03 DIAGNOSIS — E291 Testicular hypofunction: Secondary | ICD-10-CM

## 2024-07-03 DIAGNOSIS — T50905A Adverse effect of unspecified drugs, medicaments and biological substances, initial encounter: Secondary | ICD-10-CM | POA: Diagnosis not present

## 2024-07-03 MED ORDER — ESOMEPRAZOLE MAGNESIUM 40 MG PO CPDR: 40.0000 mg | DELAYED_RELEASE_CAPSULE | Freq: Every morning | ORAL | 3 refills | Status: DC

## 2024-07-03 MED ORDER — DEXMETHYLPHENIDATE HCL ER 5 MG PO CP24: 5.0000 mg | ORAL_CAPSULE | Freq: Every day | ORAL | 0 refills | Status: AC

## 2024-07-03 MED ORDER — ESOMEPRAZOLE MAGNESIUM 40 MG PO CPDR: 40.0000 mg | DELAYED_RELEASE_CAPSULE | Freq: Every morning | ORAL | 3 refills | Status: AC

## 2024-07-03 MED ORDER — TESTOSTERONE CYPIONATE 200 MG/ML IM SOLN: 120.0000 mg | INTRAMUSCULAR | 3 refills | Status: DC

## 2024-07-03 MED ORDER — AMLODIPINE BESYLATE 5 MG PO TABS: 5.0000 mg | ORAL_TABLET | Freq: Every day | ORAL | 1 refills | Status: AC

## 2024-07-03 NOTE — Progress Notes (Signed)
" ° °       Established patient visit  History, exam, impression, and plan:  1. Attention deficit hyperactivity disorder (ADHD), unspecified ADHD type (Primary) Pleasant 39 year old male presenting today for follow-up on ADHD.  Has been using Focalin  XR 5 mg daily, tolerating well without side effects.  Feels that the medication works well for focus but does not get the extra energy that the 10 mg dose gave him.  Unfortunately the 10 mg dose was not well-tolerated due to excessive dry mouth.  No interruption in sleep or appetite.  Happy with current 5 mg dose so plan to continue today.  2. Hypogonadism in male Hypogonadism with current testosterone  injections done weekly.  Has been doing well with those and has gotten more consistent with injection procedure leading to a steadier state.  Up-to-date on testosterone  checks.  Continue testosterone  120 mg IM weekly as prescribed.  Plan to check labs at next follow-up visit.  3. 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  Also consider some of the increase to be weight related.  He has gone back to exercising with [employer] and hopes to see some weight loss.  As noted an increase in headaches as well as a few episodes of significant pressure in the ears with position changes.  Since his readings have been creeping up over the last several months, feel that it may be beneficial to initiate antihypertensives to allow for blood pressure management while aiming for lifestyle changes and weight loss.  Patient is agreeable also starting amlodipine  5 mg daily.  Recommend monitoring blood pressure at home with a goal of less than 130/80.  Will message in 2 weeks to evaluate tolerance and response to the medication.  Continue working on exercise and diet changes.  Physical Exam Vitals and nursing note reviewed.  Constitutional:      General: He is not in acute distress.    Appearance: Normal  appearance. He is not ill-appearing.  HENT:     Head: Normocephalic and atraumatic.  Cardiovascular:     Rate and Rhythm: Normal rate and regular rhythm.     Pulses: Normal pulses.     Heart sounds: Normal heart sounds. No murmur heard.    No friction rub. No gallop.  Pulmonary:     Effort: Pulmonary effort is normal. No respiratory distress.     Breath sounds: Normal breath sounds.  Skin:    General: Skin is warm and dry.  Neurological:     Mental Status: He is alert and oriented to person, place, and time.  Psychiatric:        Mood and Affect: Mood normal.        Behavior: Behavior normal.        Thought Content: Thought content normal.        Judgment: Judgment normal.    Procedures performed this visit: None.  Return in about 6 months (around 12/31/2024) for ADHD/HRT/HTN follow-up.  __________________________________ [name], DNP, APRN, FNP-BC Primary Care and Sports Medicine [facility] [place] "

## 2024-07-09 ENCOUNTER — Telehealth: Payer: Self-pay

## 2024-07-09 NOTE — Telephone Encounter (Signed)
 Patient came in today for Blood pressure re-check.  BP: 122/75 Pulse: 92 O2: 96%

## 2024-07-12 ENCOUNTER — Other Ambulatory Visit: Payer: Self-pay | Admitting: Medical-Surgical

## 2024-07-12 MED ORDER — TESTOSTERONE CYPIONATE 200 MG/ML IM SOLN: 120.0000 mg | INTRAMUSCULAR | 3 refills | Status: DC

## 2024-07-14 ENCOUNTER — Encounter: Payer: Self-pay | Admitting: Medical-Surgical

## 2024-07-14 ENCOUNTER — Other Ambulatory Visit: Payer: Self-pay | Admitting: Medical-Surgical

## 2024-07-14 MED ORDER — TESTOSTERONE CYPIONATE 200 MG/ML IM SOLN: 120.0000 mg | INTRAMUSCULAR | 3 refills | Status: AC

## 2024-07-14 NOTE — Progress Notes (Signed)
 Ok. Thank you. It has been resent to the correct pharmacy and patient has been notified.

## 2024-07-14 NOTE — Progress Notes (Signed)
 Please contact [facility] at [facility] and cancel the testosterone  refills. These were sent to the incorrect pharmacy. Once cancelled, I will resend to the [facility] on [address] in [place].  Thanks, Zada
